# Patient Record
Sex: Male | Born: 1945 | Race: White | Hispanic: No | Marital: Married | Smoking: Never smoker
Health system: Southern US, Community
[De-identification: ages and names within clinical notes are randomized; demographics above are authoritative.]

## PROBLEM LIST (undated history)

## (undated) DIAGNOSIS — M1990 Unspecified osteoarthritis, unspecified site: Secondary | ICD-10-CM

## (undated) DIAGNOSIS — Z86718 Personal history of other venous thrombosis and embolism: Secondary | ICD-10-CM

## (undated) DIAGNOSIS — I499 Cardiac arrhythmia, unspecified: Secondary | ICD-10-CM

## (undated) DIAGNOSIS — E785 Hyperlipidemia, unspecified: Secondary | ICD-10-CM

## (undated) DIAGNOSIS — R42 Dizziness and giddiness: Secondary | ICD-10-CM

## (undated) DIAGNOSIS — I10 Essential (primary) hypertension: Secondary | ICD-10-CM

## (undated) DIAGNOSIS — I493 Ventricular premature depolarization: Secondary | ICD-10-CM

## (undated) DIAGNOSIS — K219 Gastro-esophageal reflux disease without esophagitis: Secondary | ICD-10-CM

## (undated) DIAGNOSIS — R9431 Abnormal electrocardiogram [ECG] [EKG]: Secondary | ICD-10-CM

## (undated) DIAGNOSIS — R001 Bradycardia, unspecified: Secondary | ICD-10-CM

## (undated) HISTORY — DX: Hyperlipidemia, unspecified: E78.5

## (undated) HISTORY — DX: Essential (primary) hypertension: I10

---
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
## 1999-02-11 HISTORY — PX: FRACTURE SURGERY: SHX138

## 2011-03-25 DIAGNOSIS — M546 Pain in thoracic spine: Secondary | ICD-10-CM | POA: Diagnosis not present

## 2011-05-02 DIAGNOSIS — D485 Neoplasm of uncertain behavior of skin: Secondary | ICD-10-CM | POA: Diagnosis not present

## 2011-05-02 DIAGNOSIS — L570 Actinic keratosis: Secondary | ICD-10-CM | POA: Diagnosis not present

## 2011-05-02 DIAGNOSIS — L821 Other seborrheic keratosis: Secondary | ICD-10-CM | POA: Diagnosis not present

## 2011-10-08 DIAGNOSIS — Z23 Encounter for immunization: Secondary | ICD-10-CM | POA: Diagnosis not present

## 2011-12-04 ENCOUNTER — Ambulatory Visit (INDEPENDENT_AMBULATORY_CARE_PROVIDER_SITE_OTHER): Payer: Medicare Other | Admitting: Family Medicine

## 2011-12-04 VITALS — BP 108/70 | HR 66 | Temp 97.9°F | Resp 16 | Ht 74.0 in | Wt 214.0 lb

## 2011-12-04 DIAGNOSIS — J029 Acute pharyngitis, unspecified: Secondary | ICD-10-CM | POA: Diagnosis not present

## 2011-12-04 DIAGNOSIS — R197 Diarrhea, unspecified: Secondary | ICD-10-CM

## 2011-12-04 DIAGNOSIS — R1110 Vomiting, unspecified: Secondary | ICD-10-CM | POA: Diagnosis not present

## 2011-12-04 LAB — POCT CBC
Granulocyte percent: 55.9 %G (ref 37–80)
HCT, POC: 42.8 % — AB (ref 43.5–53.7)
Hemoglobin: 13.3 g/dL — AB (ref 14.1–18.1)
Lymph, poc: 1.8 (ref 0.6–3.4)
MCH, POC: 29.7 pg (ref 27–31.2)
MCHC: 31.1 g/dL — AB (ref 31.8–35.4)
MCV: 95.5 fL (ref 80–97)
MID (cbc): 0.4 (ref 0–0.9)
MPV: 7.5 fL (ref 0–99.8)
POC Granulocyte: 2.9 (ref 2–6.9)
POC LYMPH PERCENT: 35.9 % (ref 10–50)
POC MID %: 8.2 %M (ref 0–12)
Platelet Count, POC: 237 10*3/uL (ref 142–424)
RBC: 4.48 M/uL — AB (ref 4.69–6.13)
RDW, POC: 13.6 %
WBC: 5.1 10*3/uL (ref 4.6–10.2)

## 2011-12-04 LAB — COMPREHENSIVE METABOLIC PANEL
ALT: 39 U/L (ref 0–53)
AST: 27 U/L (ref 0–37)
CO2: 27 mEq/L (ref 19–32)
Calcium: 9.7 mg/dL (ref 8.4–10.5)
Chloride: 108 mEq/L (ref 96–112)
Creat: 0.95 mg/dL (ref 0.50–1.35)
Potassium: 4.8 mEq/L (ref 3.5–5.3)
Sodium: 142 mEq/L (ref 135–145)
Total Protein: 7 g/dL (ref 6.0–8.3)

## 2011-12-04 LAB — POCT RAPID STREP A (OFFICE): Rapid Strep A Screen: NEGATIVE

## 2011-12-04 LAB — COMPREHENSIVE METABOLIC PANEL WITH GFR
Albumin: 4.3 g/dL (ref 3.5–5.2)
Alkaline Phosphatase: 34 U/L — ABNORMAL LOW (ref 39–117)
BUN: 14 mg/dL (ref 6–23)
Glucose, Bld: 98 mg/dL (ref 70–99)
Total Bilirubin: 0.5 mg/dL (ref 0.3–1.2)

## 2011-12-04 NOTE — Progress Notes (Signed)
 [facility]:  Office Visit  Chief Complaint:  Chief Complaint  Patient presents with  . Sore Throat    exposed to strep  . Diarrhea    had stomach sx's over the weekend- doing better now  . Emesis    HPI: Stephen Roy is a 66 y.o. male who complains of  Sore throat, msk aches and pains after vomiting and diarrhea this pass weekend. He had nonbloody diarrhea and vomiting. He was at a [place] and ate boxed lunches which is the only thing that is new, his sxs are improved.  Denies new meds or sick GI contacts. No one else at home has similar sxs. Was in golf tournament but no one else has it.  Last episode of diarrhea was 5 days ago. Now has loose stools. Has decrease PO due to loss of appetitite. MInimal abd cramps and acid indigestion. He is here with wife who has sinusitis sxs and he too has been exposed to strep throat via grandchildren.  Past Medical History  Diagnosis Date  . Hypertension   . Hyperlipidemia    Past Surgical History  Procedure Date  . Fracture surgery    History   Social History  . Marital Status: Married    Spouse Name: N/A    Number of Children: N/A  . Years of Education: N/A   Social History Main Topics  . Smoking status: Never Smoker   . Smokeless tobacco: None  . Alcohol Use: 2.5 oz/week    5 drink(s) per week  . Drug Use: No  . Sexually Active: None   Other Topics Concern  . None   Social History Narrative  . None   Family History  Problem Relation Age of Onset  . Heart disease Father    No Known Allergies Prior to Admission medications   Medication Sig Start Date End Date Taking? Authorizing Provider  amLODipine (NORVASC) 5 MG tablet Take 5 mg by mouth daily.   Yes Historical Provider, MD  atorvastatin (LIPITOR) 10 MG tablet Take 10 mg by mouth daily.   Yes Historical Provider, MD  irbesartan (AVAPRO) 150 MG tablet Take 150 mg by mouth at bedtime.   Yes Historical Provider, MD     ROS: The patient denies  fevers, chills, night sweats, unintentional weight loss, chest pain, palpitations, wheezing, dyspnea on exertion,dysuria, hematuria, melena, numbness, weakness, or tingling.  All other systems have been reviewed and were otherwise negative with the exception of those mentioned in the HPI and as above.    PHYSICAL EXAM: Filed Vitals:   12/04/11 1121  BP: 108/70  Pulse: 66  Temp: 97.9 F (36.6 C)  Resp: 16   Filed Vitals:   12/04/11 1121  Height: 6\' 2"  (1.88 m)  Weight: 214 lb (97.07 kg)   Body mass index is 27.48 kg/(m^2).  General: Alert, no acute distress HEENT:  Normocephalic, atraumatic, oropharynx patent. No exudates, Tm nl. No sinus tenderness Cardiovascular:  Regular rate and rhythm, no rubs murmurs or gallops.  No Carotid bruits, radial pulse intact. No pedal edema.  Respiratory: Clear to auscultation bilaterally.  No wheezes, rales, or rhonchi.  No cyanosis, no use of accessory musculature GI: No organomegaly, abdomen is soft and non-tender, positive bowel sounds.  No masses. Skin: No rashes. Neurologic: Facial musculature symmetric. Psychiatric: Patient is appropriate throughout our interaction. Lymphatic: No cervical lymphadenopathy Musculoskeletal: Gait intact.   LABS: Results for orders placed in visit on 12/04/11  POCT CBC  Component Value Range   WBC 5.1  4.6 - 10.2 K/uL   Lymph, poc 1.8  0.6 - 3.4   POC LYMPH PERCENT 35.9  10 - 50 %L   MID (cbc) 0.4  0 - 0.9   POC MID % 8.2  0 - 12 %M   POC Granulocyte 2.9  2 - 6.9   Granulocyte percent 55.9  37 - 80 %G   RBC 4.48 (*) 4.69 - 6.13 M/uL   Hemoglobin 13.3 (*) 14.1 - 18.1 g/dL   HCT, POC 95.2 (*) 84.1 - 53.7 %   MCV 95.5  80 - 97 fL   MCH, POC 29.7  27 - 31.2 pg   MCHC 31.1 (*) 31.8 - 35.4 g/dL   RDW, POC 32.4     Platelet Count, POC 237  142 - 424 K/uL   MPV 7.5  0 - 99.8 fL  POCT RAPID STREP A (OFFICE)      Component Value Range   Rapid Strep A Screen Negative  Negative     EKG/XRAY:     Primary read interpreted by Dr. Conley Rolls at [facility].   ASSESSMENT/PLAN: Encounter Diagnoses  Name Primary?  . Diarrhea Yes  . Vomiting   . Pharyngitis     Rx for Amox 875 mg BID #20 , no RF-- given for pahryngitis and strep exposure Recommend no to take it unless sxs worsen, his GI sxs which are most lilkely viral gastroenteritis BRAT and push fluids   ,  PHUONG, DO 12/04/2011 12:53 PM

## 2011-12-21 ENCOUNTER — Encounter: Payer: Self-pay | Admitting: Family Medicine

## 2012-02-16 DIAGNOSIS — E7800 Pure hypercholesterolemia, unspecified: Secondary | ICD-10-CM | POA: Diagnosis not present

## 2012-02-16 DIAGNOSIS — Z0000 Encounter for general adult medical examination without abnormal findings: Secondary | ICD-10-CM | POA: Diagnosis not present

## 2012-02-16 DIAGNOSIS — Z8042 Family history of malignant neoplasm of prostate: Secondary | ICD-10-CM | POA: Diagnosis not present

## 2012-02-16 DIAGNOSIS — E782 Mixed hyperlipidemia: Secondary | ICD-10-CM | POA: Diagnosis not present

## 2012-02-16 DIAGNOSIS — I10 Essential (primary) hypertension: Secondary | ICD-10-CM | POA: Diagnosis not present

## 2012-02-16 DIAGNOSIS — M2550 Pain in unspecified joint: Secondary | ICD-10-CM | POA: Diagnosis not present

## 2012-03-18 DIAGNOSIS — M25559 Pain in unspecified hip: Secondary | ICD-10-CM | POA: Diagnosis not present

## 2012-03-18 DIAGNOSIS — M76899 Other specified enthesopathies of unspecified lower limb, excluding foot: Secondary | ICD-10-CM | POA: Diagnosis not present

## 2012-05-06 DIAGNOSIS — D235 Other benign neoplasm of skin of trunk: Secondary | ICD-10-CM | POA: Diagnosis not present

## 2012-05-06 DIAGNOSIS — L570 Actinic keratosis: Secondary | ICD-10-CM | POA: Diagnosis not present

## 2012-11-30 DIAGNOSIS — Z23 Encounter for immunization: Secondary | ICD-10-CM | POA: Diagnosis not present

## 2013-01-02 ENCOUNTER — Ambulatory Visit (INDEPENDENT_AMBULATORY_CARE_PROVIDER_SITE_OTHER): Payer: Medicare Other | Admitting: Family Medicine

## 2013-01-02 VITALS — BP 136/72 | HR 78 | Temp 99.6°F | Resp 18 | Ht 74.0 in | Wt 218.6 lb

## 2013-01-02 DIAGNOSIS — R059 Cough, unspecified: Secondary | ICD-10-CM | POA: Diagnosis not present

## 2013-01-02 DIAGNOSIS — J209 Acute bronchitis, unspecified: Secondary | ICD-10-CM | POA: Diagnosis not present

## 2013-01-02 DIAGNOSIS — J019 Acute sinusitis, unspecified: Secondary | ICD-10-CM

## 2013-01-02 DIAGNOSIS — R05 Cough: Secondary | ICD-10-CM | POA: Diagnosis not present

## 2013-01-02 MED ORDER — FLUTICASONE PROPIONATE 50 MCG/ACT NA SUSP: 2.0000 | Freq: Every day | NASAL | Status: DC

## 2013-01-02 MED ORDER — AZITHROMYCIN 250 MG PO TABS: ORAL_TABLET | ORAL | Status: DC

## 2013-01-02 NOTE — Progress Notes (Signed)
[facility]:  Office Visit  Chief Complaint:  Chief Complaint  Patient presents with  . Cough    started 1 week ago, no improvements, has had flu vaccine  . Nasal Congestion  . Generalized Body Aches    HPI: Stephen Roy is a 67 y.o. male who is here for runny nose, stuffy, productive cough at times with yellowish-green-brown sputum, chest tightness with deep inspiration, body aches in muscles and joints.  Pt states these symptoms started 7 days ago, that included sore throat. Taking Claridin D in am and Benedryl over the counter in pm since last week and sore throat is now itchy and dry. Flu vaccine UTD. HE is planning to go to [place] to go skiing for about 2 weeks soon and wants to start endurance training for it. Deneis SOB, wheezes. Has tried otc meds with some relief.  Denies asthma/allergies Pt in car with 67 year old and 7 year old grandchildren for 12 hours during road trip Friday (12/24/2012) to [facility].  67yr old had runny nose.   Past Medical History  Diagnosis Date  . Hypertension   . Hyperlipidemia    Past Surgical History  Procedure Laterality Date  . Fracture surgery    . Tonsillectomy     History   Social History  . Marital Status: Married    Spouse Name: N/A    Number of Children: N/A  . Years of Education: N/A   Social History Main Topics  . Smoking status: Never Smoker   . Smokeless tobacco: None  . Alcohol Use: 2.5 oz/week    5 drink(s) per week  . Drug Use: No  . Sexual Activity: None   Other Topics Concern  . None   Social History Narrative  . None   Family History  Problem Relation Age of Onset  . Heart disease Father    No Known Allergies Prior to Admission medications   Medication Sig Start Date End Date Taking? Authorizing Provider  amLODipine (NORVASC) 5 MG tablet Take 5 mg by mouth daily.   Yes Historical Provider, MD  atorvastatin (LIPITOR) 10 MG tablet Take 10 mg by mouth daily.   Yes Historical Provider,  MD  irbesartan (AVAPRO) 150 MG tablet Take 150 mg by mouth at bedtime.   Yes Historical Provider, MD     ROS: The patient denies fevers, chills, night sweats, unintentional weight loss,  palpitations, wheezing, dyspnea on exertion, nausea, vomiting, abdominal pain, dysuria, hematuria, melena, numbness, weakness, or tingling.   All other systems have been reviewed and were otherwise negative with the exception of those mentioned in the HPI and as above.    PHYSICAL EXAM: Filed Vitals:   01/02/13 1150  BP: 136/72  Pulse: 78  Temp: 99.6 F (37.6 C)  Resp: 18  Spo2 98% Filed Vitals:   01/02/13 1150  Height: 6\' 2"  (1.88 m)  Weight: 218 lb 9.6 oz (99.156 kg)   Body mass index is 28.05 kg/(m^2).  General: Alert, no acute distress HEENT:  Normocephalic, atraumatic, oropharynx patent. EOMI, PERRLA, + sinus tenderness, Tm nl, no exudates Cardiovascular:  Regular rate and rhythm, no rubs murmurs or gallops.  No Carotid bruits, radial pulse intact. No pedal edema.  Respiratory: Clear to auscultation bilaterally.  No wheezes, rales, or rhonchi.  No cyanosis, no use of accessory musculature GI: No organomegaly, abdomen is soft and non-tender, positive bowel sounds.  No masses. Skin: No rashes. Neurologic: Facial musculature symmetric. Psychiatric: Patient is appropriate  throughout our interaction. Lymphatic: No cervical lymphadenopathy Musculoskeletal: Gait intact.   LABS: Results for orders placed in visit on 12/04/11  COMPREHENSIVE METABOLIC PANEL      Result Value Range   Sodium 142  135 - 145 mEq/L   Potassium 4.8  3.5 - 5.3 mEq/L   Chloride 108  96 - 112 mEq/L   CO2 27  19 - 32 mEq/L   Glucose, Bld 98  70 - 99 mg/dL   BUN 14  6 - 23 mg/dL   Creat 1.47  8.29 - 5.62 mg/dL   Total Bilirubin 0.5  0.3 - 1.2 mg/dL   Alkaline Phosphatase 34 (*) 39 - 117 U/L   AST 27  0 - 37 U/L   ALT 39  0 - 53 U/L   Total Protein 7.0  6.0 - 8.3 g/dL   Albumin 4.3  3.5 - 5.2 g/dL   Calcium 9.7   8.4 - 13.0 mg/dL  POCT CBC      Result Value Range   WBC 5.1  4.6 - 10.2 K/uL   Lymph, poc 1.8  0.6 - 3.4   POC LYMPH PERCENT 35.9  10 - 50 %L   MID (cbc) 0.4  0 - 0.9   POC MID % 8.2  0 - 12 %M   POC Granulocyte 2.9  2 - 6.9   Granulocyte percent 55.9  37 - 80 %G   RBC 4.48 (*) 4.69 - 6.13 M/uL   Hemoglobin 13.3 (*) 14.1 - 18.1 g/dL   HCT, POC 86.5 (*) 78.4 - 53.7 %   MCV 95.5  80 - 97 fL   MCH, POC 29.7  27 - 31.2 pg   MCHC 31.1 (*) 31.8 - 35.4 g/dL   RDW, POC 69.6     Platelet Count, POC 237  142 - 424 K/uL   MPV 7.5  0 - 99.8 fL  POCT RAPID STREP A (OFFICE)      Result Value Range   Rapid Strep A Screen Negative  Negative     EKG/XRAY:   Primary read interpreted by Dr. Conley Rolls at [facility].   ASSESSMENT/PLAN: Encounter Diagnoses  Name Primary?  . Sinusitis, acute Yes  . Acute bronchitis   . Cough    Rx Zpack, flonase otc cough medicine Sx treatment first , if no improvement then try zpack F/u prn  Gross sideeffects, risk and benefits, and alternatives of medications d/w patient. Patient is aware that all medications have potential sideeffects and we are unable to predict every sideeffect or drug-drug interaction that may occur.  LE, THAO PHUONG, DO 01/09/2013 1:58 PM

## 2013-01-02 NOTE — Patient Instructions (Signed)
Acute Bronchitis  Bronchitis is when the airways that extend from the windpipe into the lungs get red, puffy, and painful (inflamed). Bronchitis often causes thick spit (mucus) to develop. This leads to a cough. A cough is the most common symptom of bronchitis.  In acute bronchitis, the condition usually begins suddenly and goes away over time (usually in 2 weeks). Smoking, allergies, and asthma can make bronchitis worse. Repeated episodes of bronchitis may cause more lung problems.  HOME CARE  · Rest.  · Drink enough fluids to keep your pee (urine) clear or pale yellow (unless you need to limit fluids as told by your doctor).  · Only take over-the-counter or prescription medicines as told by your doctor.  · Avoid smoking and secondhand smoke. These can make bronchitis worse. If you are a smoker, think about using nicotine gum or skin patches. Quitting smoking will help your lungs heal faster.  · Reduce the chance of getting bronchitis again by:  · Washing your hands often.  · Avoiding people with cold symptoms.  · Trying not to touch your hands to your mouth, nose, or eyes.  · Follow up with your doctor as told.  GET HELP IF:  Your symptoms do not improve after 1 week of treatment. Symptoms include:  · Cough.  · Fever.  · Coughing up thick spit.  · Body aches.  · Chest congestion.  · Chills.  · Shortness of breath.  · Sore throat.  GET HELP RIGHT AWAY IF:   · You have an increased fever.  · You have chills.  · You have severe shortness of breath.  · You have bloody thick spit (sputum).  · You throw up (vomit) often.  · You lose too much body fluid (dehydration).  · You have a severe headache.  · You faint.  MAKE SURE YOU:   · Understand these instructions.  · Will watch your condition.  · Will get help right away if you are not doing well or get worse.  Document Released: 07/16/2007 Document Revised: 09/29/2012 Document Reviewed: 07/20/2012  Exit[employer]® Patient Information ©2014 [employer].

## 2013-06-06 ENCOUNTER — Encounter: Payer: Self-pay | Admitting: Gastroenterology

## 2013-06-06 DIAGNOSIS — E785 Hyperlipidemia, unspecified: Secondary | ICD-10-CM | POA: Diagnosis not present

## 2013-06-06 DIAGNOSIS — I10 Essential (primary) hypertension: Secondary | ICD-10-CM | POA: Diagnosis not present

## 2013-06-06 DIAGNOSIS — Z6829 Body mass index (BMI) 29.0-29.9, adult: Secondary | ICD-10-CM | POA: Diagnosis not present

## 2013-06-06 DIAGNOSIS — Z1331 Encounter for screening for depression: Secondary | ICD-10-CM | POA: Diagnosis not present

## 2013-07-08 ENCOUNTER — Telehealth: Payer: Self-pay | Admitting: Gastroenterology

## 2013-07-08 NOTE — Telephone Encounter (Signed)
Left message on machine to call back previsit on colon cx recall in EPIC

## 2013-07-08 NOTE — Telephone Encounter (Signed)
Outside records  Colonoscopy 11/2006 Dr. Loreli Dollar Stephen Roy's [facility]; done for "had colonoscopy several years ago with removal of some polyps." Findings: a "minute polyp", sigmoid diverticulosis. Polyp was a tubular adenoma on pathology. He was recommended to have repeat colonoscopy in 3 years. Colonoscopy 10/2009 Dr. Margart Sickles, same location; done for "patient had an adenoma removed 3 years ago and now presents for follow up."  Findings; diverticulosis.  Recommended "colonoscopy in 4-5 years would be indicated"   I see he is scheduled for pre-visit appointment next week. Can you call him.  I reviewed his records and he does not need surveillance at least until 10/2014.  Please cancel his upcoming previsit and colonoscopy.  Offer him [facility] office appt if he wants to discuss this in the office.  Recall colonoscopy 10/2014.

## 2013-07-11 DIAGNOSIS — E785 Hyperlipidemia, unspecified: Secondary | ICD-10-CM | POA: Diagnosis not present

## 2013-07-11 DIAGNOSIS — I10 Essential (primary) hypertension: Secondary | ICD-10-CM | POA: Diagnosis not present

## 2013-07-11 DIAGNOSIS — Z125 Encounter for screening for malignant neoplasm of prostate: Secondary | ICD-10-CM | POA: Diagnosis not present

## 2013-07-11 NOTE — Telephone Encounter (Signed)
I have spoken to patient and have given him Dr Ardis Hughs recommendations as below. He verbalizes understanding. Patient does not wish for office visit to discuss at this time, but if he changes his mind, he will call back.

## 2013-07-18 DIAGNOSIS — Z6829 Body mass index (BMI) 29.0-29.9, adult: Secondary | ICD-10-CM | POA: Diagnosis not present

## 2013-07-18 DIAGNOSIS — Z125 Encounter for screening for malignant neoplasm of prostate: Secondary | ICD-10-CM | POA: Diagnosis not present

## 2013-07-18 DIAGNOSIS — E785 Hyperlipidemia, unspecified: Secondary | ICD-10-CM | POA: Diagnosis not present

## 2013-07-18 DIAGNOSIS — I10 Essential (primary) hypertension: Secondary | ICD-10-CM | POA: Diagnosis not present

## 2013-07-18 DIAGNOSIS — Z0000 Encounter for general adult medical examination without abnormal findings: Secondary | ICD-10-CM | POA: Diagnosis not present

## 2013-07-18 DIAGNOSIS — Z23 Encounter for immunization: Secondary | ICD-10-CM | POA: Diagnosis not present

## 2013-07-19 DIAGNOSIS — Z1212 Encounter for screening for malignant neoplasm of rectum: Secondary | ICD-10-CM | POA: Diagnosis not present

## 2013-07-25 ENCOUNTER — Encounter: Payer: No Typology Code available for payment source | Admitting: Gastroenterology

## 2013-08-01 ENCOUNTER — Encounter: Payer: No Typology Code available for payment source | Admitting: Gastroenterology

## 2013-09-26 DIAGNOSIS — H02839 Dermatochalasis of unspecified eye, unspecified eyelid: Secondary | ICD-10-CM | POA: Diagnosis not present

## 2013-09-26 DIAGNOSIS — H01009 Unspecified blepharitis unspecified eye, unspecified eyelid: Secondary | ICD-10-CM | POA: Diagnosis not present

## 2013-09-26 DIAGNOSIS — H2510 Age-related nuclear cataract, unspecified eye: Secondary | ICD-10-CM | POA: Diagnosis not present

## 2014-02-14 DIAGNOSIS — Z23 Encounter for immunization: Secondary | ICD-10-CM | POA: Diagnosis not present

## 2014-06-02 DIAGNOSIS — D2372 Other benign neoplasm of skin of left lower limb, including hip: Secondary | ICD-10-CM | POA: Diagnosis not present

## 2014-06-02 DIAGNOSIS — L821 Other seborrheic keratosis: Secondary | ICD-10-CM | POA: Diagnosis not present

## 2014-06-02 DIAGNOSIS — L820 Inflamed seborrheic keratosis: Secondary | ICD-10-CM | POA: Diagnosis not present

## 2014-06-02 DIAGNOSIS — L738 Other specified follicular disorders: Secondary | ICD-10-CM | POA: Diagnosis not present

## 2014-06-02 DIAGNOSIS — D485 Neoplasm of uncertain behavior of skin: Secondary | ICD-10-CM | POA: Diagnosis not present

## 2014-06-02 DIAGNOSIS — L570 Actinic keratosis: Secondary | ICD-10-CM | POA: Diagnosis not present

## 2014-08-04 DIAGNOSIS — E785 Hyperlipidemia, unspecified: Secondary | ICD-10-CM | POA: Diagnosis not present

## 2014-08-04 DIAGNOSIS — I10 Essential (primary) hypertension: Secondary | ICD-10-CM | POA: Diagnosis not present

## 2014-08-04 DIAGNOSIS — Z125 Encounter for screening for malignant neoplasm of prostate: Secondary | ICD-10-CM | POA: Diagnosis not present

## 2014-08-10 DIAGNOSIS — Z23 Encounter for immunization: Secondary | ICD-10-CM | POA: Diagnosis not present

## 2014-08-10 DIAGNOSIS — Z6828 Body mass index (BMI) 28.0-28.9, adult: Secondary | ICD-10-CM | POA: Diagnosis not present

## 2014-08-10 DIAGNOSIS — E785 Hyperlipidemia, unspecified: Secondary | ICD-10-CM | POA: Diagnosis not present

## 2014-08-10 DIAGNOSIS — Z1389 Encounter for screening for other disorder: Secondary | ICD-10-CM | POA: Diagnosis not present

## 2014-08-10 DIAGNOSIS — I10 Essential (primary) hypertension: Secondary | ICD-10-CM | POA: Diagnosis not present

## 2014-08-10 DIAGNOSIS — Z0000 Encounter for general adult medical examination without abnormal findings: Secondary | ICD-10-CM | POA: Diagnosis not present

## 2014-08-16 DIAGNOSIS — Z1212 Encounter for screening for malignant neoplasm of rectum: Secondary | ICD-10-CM | POA: Diagnosis not present

## 2014-09-13 ENCOUNTER — Ambulatory Visit (INDEPENDENT_AMBULATORY_CARE_PROVIDER_SITE_OTHER): Payer: Medicare Other | Admitting: Emergency Medicine

## 2014-09-13 VITALS — BP 128/70 | HR 53 | Temp 98.5°F | Resp 16 | Ht 74.0 in | Wt 211.4 lb

## 2014-09-13 DIAGNOSIS — S91001A Unspecified open wound, right ankle, initial encounter: Secondary | ICD-10-CM | POA: Diagnosis not present

## 2014-09-13 MED ORDER — SILVER SULFADIAZINE 1 % EX CREA: 1.0000 "application " | TOPICAL_CREAM | Freq: Every day | CUTANEOUS | Status: DC

## 2014-09-13 NOTE — Progress Notes (Addendum)
Patient ID: Stephen Roy, male   DOB: 14-Oct-1945, 69 y.o.   MRN: [id number]    This chart was scribed for Nena Jordan, MD by [facility], medical scribe at [facility].The patient was seen in exam room 03 and the patient's care was started at 3:05 PM.  Chief Complaint:  Chief Complaint  Patient presents with  . Burn    rt. ankle, x 1 day    HPI: Stephen Roy is a 69 y.o. male who reports to [facility] today complaining of a chemical burn to the right ankle. He was playing golf yesterday and suddenly felt a sting, burning sensation. He noticed and noticed a soapy substance on his right ankle. Pt is unsure what the source is. Sprayed hydrogen peroxide last night and used a bactroban ointment. No pain with movement of the ankle, but sore to the touch.  Past Medical History  Diagnosis Date  . Hypertension   . Hyperlipidemia    Past Surgical History  Procedure Laterality Date  . Fracture surgery    . Tonsillectomy     History   Social History  . Marital Status: Married    Spouse Name: N/A  . Number of Children: N/A  . Years of Education: N/A   Social History Main Topics  . Smoking status: Never Smoker   . Smokeless tobacco: Never Used  . Alcohol Use: 2.5 oz/week    5 Standard drinks or equivalent per week  . Drug Use: No  . Sexual Activity: Not on file   Other Topics Concern  . None   Social History Narrative   Family History  Problem Relation Age of Onset  . Heart disease Father    No Known Allergies Prior to Admission medications   Medication Sig Start Date End Date Taking? Authorizing Provider  amLODipine (NORVASC) 5 MG tablet Take 5 mg by mouth daily.   Yes Historical Provider, MD  atorvastatin (LIPITOR) 10 MG tablet Take 10 mg by mouth daily.   Yes Historical Provider, MD  irbesartan (AVAPRO) 150 MG tablet Take 150 mg by mouth at bedtime.   Yes Historical Provider, MD  azithromycin (ZITHROMAX) 250 MG tablet Take 2 tabs po now, then 1 tab po  daily. Patient not taking: Reported on 09/13/2014 01/02/13   Thao P Le, DO  fluticasone (FLONASE) 50 MCG/ACT nasal spray Place 2 sprays into both nostrils daily. Patient not taking: Reported on 09/13/2014 01/02/13   Thao P Le, DO   ROS: The patient denies fevers, chills, night sweats, unintentional weight loss, chest pain, palpitations, wheezing, dyspnea on exertion, nausea, vomiting, abdominal pain, dysuria, hematuria, melena, numbness, weakness, or tingling.   All other systems have been reviewed and were otherwise negative with the exception of those mentioned in the HPI and as above.    PHYSICAL EXAM: Filed Vitals:   09/13/14 1414  BP: 128/70  Pulse: 53  Temp: 98.5 F (36.9 C)  Resp: 16   Body mass index is 27.13 kg/(m^2).   General: Alert, no acute distress HEENT:  Normocephalic, atraumatic, oropharynx patent. Eye: Juliette Mangle [facility] Cardiovascular:  Regular rate and rhythm, no rubs murmurs or gallops.  No Carotid bruits, radial pulse intact. No pedal edema.  Respiratory: Clear to auscultation bilaterally.  No wheezes, rales, or rhonchi.  No cyanosis, no use of accessory musculature Abdominal: No organomegaly, abdomen is soft and non-tender, positive bowel sounds.  No masses. Musculoskeletal: Gait intact. No edema, tenderness Skin: There is a 5 x 6  cm area of skin loss on the right lateral malleolus. No surrounding erythema or evidence of skin loss. Neurologic: Facial musculature symmetric. Psychiatric: Patient acts appropriately throughout our interaction. Lymphatic: No cervical or submandibular lymphadenopathy Genitourinary/Anorectal: No acute findings  LABS: Results for orders placed or performed in visit on 12/04/11  Comprehensive metabolic panel  Result Value Ref Range   Sodium 142 135 - 145 mEq/L   Potassium 4.8 3.5 - 5.3 mEq/L   Chloride 108 96 - 112 mEq/L   CO2 27 19 - 32 mEq/L   Glucose, Bld 98 70 - 99 mg/dL   BUN 14 6 - 23 mg/dL   Creat 0.95 0.50 - 1.35 mg/dL   Total  Bilirubin 0.5 0.3 - 1.2 mg/dL   Alkaline Phosphatase 34 (L) 39 - 117 U/L   AST 27 0 - 37 U/L   ALT 39 0 - 53 U/L   Total Protein 7.0 6.0 - 8.3 g/dL   Albumin 4.3 3.5 - 5.2 g/dL   Calcium 9.7 8.4 - 10.5 mg/dL  POCT CBC  Result Value Ref Range   WBC 5.1 4.6 - 10.2 K/uL   Lymph, poc 1.8 0.6 - 3.4   POC LYMPH PERCENT 35.9 10 - 50 %L   MID (cbc) 0.4 0 - 0.9   POC MID % 8.2 0 - 12 %M   POC Granulocyte 2.9 2 - 6.9   Granulocyte percent 55.9 37 - 80 %G   RBC 4.48 (A) 4.69 - 6.13 M/uL   Hemoglobin 13.3 (A) 14.1 - 18.1 g/dL   HCT, POC 42.8 (A) 43.5 - 53.7 %   MCV 95.5 80 - 97 fL   MCH, POC 29.7 27 - 31.2 pg   MCHC 31.1 (A) 31.8 - 35.4 g/dL   RDW, POC 13.6 %   Platelet Count, POC 237 142 - 424 K/uL   MPV 7.5 0 - 99.8 fL  POCT rapid strep A  Result Value Ref Range   Rapid Strep A Screen Negative Negative   EKG/XRAY:   Primary read interpreted by Dr. Everlene Farrier at [facility].  ASSESSMENT/PLAN: Patient has what looks like chemical burn on his ankle. He will treat this with Silvadene. Recheck if worsening or redness around the burn site. His tetanus is up-to-date given in 2015.  Gross sideeffects, risk and benefits, and alternatives of medications d/w patient. Patient is aware that all medications have potential sideeffects and we are unable to predict every sideeffect or drug-drug interaction that may occur.    Arlyss Queen MD 09/13/2014 3:05 PM

## 2014-09-13 NOTE — Patient Instructions (Signed)
Apply cream twice a day after soap and water cleaning. Return to clinic if you develop any redness around the burn site. Recheck in one week if not having significant healing.

## 2014-09-25 DIAGNOSIS — H2513 Age-related nuclear cataract, bilateral: Secondary | ICD-10-CM | POA: Diagnosis not present

## 2014-11-27 ENCOUNTER — Encounter: Payer: Self-pay | Admitting: Gastroenterology

## 2015-01-26 DIAGNOSIS — L570 Actinic keratosis: Secondary | ICD-10-CM | POA: Diagnosis not present

## 2015-01-26 DIAGNOSIS — D2372 Other benign neoplasm of skin of left lower limb, including hip: Secondary | ICD-10-CM | POA: Diagnosis not present

## 2015-01-26 DIAGNOSIS — L821 Other seborrheic keratosis: Secondary | ICD-10-CM | POA: Diagnosis not present

## 2015-01-26 DIAGNOSIS — Z23 Encounter for immunization: Secondary | ICD-10-CM | POA: Diagnosis not present

## 2015-01-26 DIAGNOSIS — D485 Neoplasm of uncertain behavior of skin: Secondary | ICD-10-CM | POA: Diagnosis not present

## 2015-01-26 DIAGNOSIS — L578 Other skin changes due to chronic exposure to nonionizing radiation: Secondary | ICD-10-CM | POA: Diagnosis not present

## 2015-01-26 DIAGNOSIS — D1801 Hemangioma of skin and subcutaneous tissue: Secondary | ICD-10-CM | POA: Diagnosis not present

## 2015-04-20 DIAGNOSIS — R238 Other skin changes: Secondary | ICD-10-CM | POA: Diagnosis not present

## 2015-04-20 DIAGNOSIS — L538 Other specified erythematous conditions: Secondary | ICD-10-CM | POA: Diagnosis not present

## 2015-04-20 DIAGNOSIS — L578 Other skin changes due to chronic exposure to nonionizing radiation: Secondary | ICD-10-CM | POA: Diagnosis not present

## 2015-04-20 DIAGNOSIS — L820 Inflamed seborrheic keratosis: Secondary | ICD-10-CM | POA: Diagnosis not present

## 2015-04-20 DIAGNOSIS — L309 Dermatitis, unspecified: Secondary | ICD-10-CM | POA: Diagnosis not present

## 2015-04-20 DIAGNOSIS — L84 Corns and callosities: Secondary | ICD-10-CM | POA: Diagnosis not present

## 2015-08-10 DIAGNOSIS — I10 Essential (primary) hypertension: Secondary | ICD-10-CM | POA: Diagnosis not present

## 2015-08-10 DIAGNOSIS — E784 Other hyperlipidemia: Secondary | ICD-10-CM | POA: Diagnosis not present

## 2015-08-10 DIAGNOSIS — Z125 Encounter for screening for malignant neoplasm of prostate: Secondary | ICD-10-CM | POA: Diagnosis not present

## 2015-08-20 DIAGNOSIS — I10 Essential (primary) hypertension: Secondary | ICD-10-CM | POA: Diagnosis not present

## 2015-08-20 DIAGNOSIS — Z6829 Body mass index (BMI) 29.0-29.9, adult: Secondary | ICD-10-CM | POA: Diagnosis not present

## 2015-08-20 DIAGNOSIS — Z23 Encounter for immunization: Secondary | ICD-10-CM | POA: Diagnosis not present

## 2015-08-20 DIAGNOSIS — Z1389 Encounter for screening for other disorder: Secondary | ICD-10-CM | POA: Diagnosis not present

## 2015-08-20 DIAGNOSIS — Z125 Encounter for screening for malignant neoplasm of prostate: Secondary | ICD-10-CM | POA: Diagnosis not present

## 2015-08-20 DIAGNOSIS — Z0000 Encounter for general adult medical examination without abnormal findings: Secondary | ICD-10-CM | POA: Diagnosis not present

## 2015-08-20 DIAGNOSIS — E784 Other hyperlipidemia: Secondary | ICD-10-CM | POA: Diagnosis not present

## 2015-08-22 ENCOUNTER — Encounter: Payer: Self-pay | Admitting: Gastroenterology

## 2015-08-27 DIAGNOSIS — Z1212 Encounter for screening for malignant neoplasm of rectum: Secondary | ICD-10-CM | POA: Diagnosis not present

## 2015-10-01 DIAGNOSIS — H2513 Age-related nuclear cataract, bilateral: Secondary | ICD-10-CM | POA: Diagnosis not present

## 2015-10-01 DIAGNOSIS — Z0100 Encounter for examination of eyes and vision without abnormal findings: Secondary | ICD-10-CM | POA: Diagnosis not present

## 2015-10-10 ENCOUNTER — Ambulatory Visit (AMBULATORY_SURGERY_CENTER): Payer: Self-pay | Admitting: *Deleted

## 2015-10-10 VITALS — Ht 74.0 in | Wt 210.2 lb

## 2015-10-10 DIAGNOSIS — Z86010 Personal history of colonic polyps: Secondary | ICD-10-CM

## 2015-10-10 MED ORDER — NA SULFATE-K SULFATE-MG SULF 17.5-3.13-1.6 GM/177ML PO SOLN: 1.0000 | Freq: Once | ORAL | 0 refills | Status: AC

## 2015-10-10 NOTE — Progress Notes (Signed)
No allergies to eggs or soy. No problems with anesthesia.  Pt not given Emmi instructions for colonoscopy; pt declined  No oxygen use  No diet drug use  

## 2015-10-12 HISTORY — PX: COLONOSCOPY: SHX174

## 2015-10-23 ENCOUNTER — Encounter: Payer: Self-pay | Admitting: Gastroenterology

## 2015-10-23 ENCOUNTER — Ambulatory Visit (AMBULATORY_SURGERY_CENTER): Payer: Medicare Other | Admitting: Gastroenterology

## 2015-10-23 VITALS — BP 141/57 | HR 53 | Temp 97.8°F | Resp 12 | Ht 74.0 in | Wt 211.0 lb

## 2015-10-23 DIAGNOSIS — I10 Essential (primary) hypertension: Secondary | ICD-10-CM | POA: Diagnosis not present

## 2015-10-23 DIAGNOSIS — Z86010 Personal history of colonic polyps: Secondary | ICD-10-CM

## 2015-10-23 DIAGNOSIS — K5730 Diverticulosis of large intestine without perforation or abscess without bleeding: Secondary | ICD-10-CM | POA: Diagnosis not present

## 2015-10-23 DIAGNOSIS — D122 Benign neoplasm of ascending colon: Secondary | ICD-10-CM

## 2015-10-23 DIAGNOSIS — E7800 Pure hypercholesterolemia, unspecified: Secondary | ICD-10-CM | POA: Diagnosis not present

## 2015-10-23 MED ORDER — SODIUM CHLORIDE 0.9 % IV SOLN: 500.0000 mL | INTRAVENOUS | Status: DC

## 2015-10-23 NOTE — Progress Notes (Signed)
Called to room to assist during endoscopic procedure.  Patient ID and intended procedure confirmed with present staff. Received instructions for my participation in the procedure from the performing physician.  

## 2015-10-23 NOTE — Progress Notes (Signed)
No problems noted in the recovery room. maw 

## 2015-10-23 NOTE — Progress Notes (Signed)
Patient awakening,vss,report to rn 

## 2015-10-23 NOTE — Patient Instructions (Signed)
YOU HAD AN ENDOSCOPIC PROCEDURE TODAY AT [facility]:   Refer to the procedure report that was given to you for any specific questions about what was found during the examination.  If the procedure report does not answer your questions, please call your gastroenterologist to clarify.  If you requested that your care partner not be given the details of your procedure findings, then the procedure report has been included in a sealed envelope for you to review at your convenience later.  YOU SHOULD EXPECT: Some feelings of bloating in the abdomen. Passage of more gas than usual.  Walking can help get rid of the air that was put into your GI tract during the procedure and reduce the bloating. If you had a lower endoscopy (such as a colonoscopy or flexible sigmoidoscopy) you may notice spotting of blood in your stool or on the toilet paper. If you underwent a bowel prep for your procedure, you may not have a normal bowel movement for a few days.  Please Note:  You might notice some irritation and congestion in your nose or some drainage.  This is from the oxygen used during your procedure.  There is no need for concern and it should clear up in a day or so.  SYMPTOMS TO REPORT IMMEDIATELY:   Following lower endoscopy (colonoscopy or flexible sigmoidoscopy):  Excessive amounts of blood in the stool  Significant tenderness or worsening of abdominal pains  Swelling of the abdomen that is new, acute  Fever of 100F or higher   Following upper endoscopy (EGD)  Vomiting of blood or coffee ground material  New chest pain or pain under the shoulder blades  Painful or persistently difficult swallowing  New shortness of breath  Fever of 100F or higher  Black, tarry-looking stools  For urgent or emergent issues, a gastroenterologist can be reached at any hour by calling [phone].   DIET:  We do recommend a small meal at first, but then you may proceed to your regular diet.  Drink  plenty of fluids but you should avoid alcoholic beverages for 24 hours.  ACTIVITY:  You should plan to take it easy for the rest of today and you should NOT DRIVE or use heavy machinery until tomorrow (because of the sedation medicines used during the test).    FOLLOW UP: Our staff will call the number listed on your records the next business day following your procedure to check on you and address any questions or concerns that you may have regarding the information given to you following your procedure. If we do not reach you, we will leave a message.  However, if you are feeling well and you are not experiencing any problems, there is no need to return our call.  We will assume that you have returned to your regular daily activities without incident.  If any biopsies were taken you will be contacted by phone or by letter within the next 1-3 weeks.  Please call us at [phone] if you have not heard about the biopsies in 3 weeks.    SIGNATURES/CONFIDENTIALITY: You and/or your care partner have signed paperwork which will be entered into your electronic medical record.  These signatures attest to the fact that that the information above on your After Visit Summary has been reviewed and is understood.  Full responsibility of the confidentiality of this discharge information lies with you and/or your care-partner.   Handouts were given to your care partner on polyps and  diverticulosis. No aspirin, aspirin products,  ibuprofen, naproxen, advil, motrin, aleve, or other non-steroidal anti-inflammatory drugs for 10 days after polyp removal. You may resume your other current medications today. Await biopsy results. Please call if any questions or concerns.

## 2015-10-23 NOTE — Op Note (Signed)
[facility] Patient Name: Stephen Roy Procedure Date: 10/23/2015 9:44 AM MRN: [id number] Endoscopist: Milus Banister , MD Age: 70 Referring MD:  Date of Birth: [birth year] Gender: Male Account #: [id number] Procedure:                Colonoscopy Indications:              High risk colon cancer surveillance: Personal                            history of colonic polyps Colonoscopy 11/2006 Dr.                            Loreli [facility]; done for                            "had colonoscopy several years ago with removal of                            some polyps." Findings: a "minute polyp", sigmoid                            diverticulosis. Polyp was a tubular adenoma on                            pathology. He was recommended to have repeat                            colonoscopy in 3 years. Colonoscopy 10/2009 Dr.                            Margart Sickles, same location; done for "patient had an                            adenoma removed 3 years ago and now presents for                            follow up." Findings; diverticulosis. Recommended                            "colonoscopy in 4-5 years would be indicated" Medicines:                Monitored Anesthesia Care Procedure:                Pre-Anesthesia Assessment:                           - Prior to the procedure, a History and Physical                            was performed, and patient medications and                            allergies were reviewed. The patient's tolerance of  previous anesthesia was also reviewed. The risks                            and benefits of the procedure and the sedation                            options and risks were discussed with the patient.                            All questions were answered, and informed consent                            was obtained. Prior Anticoagulants: The patient has                            taken no  previous anticoagulant or antiplatelet                            agents. ASA Grade Assessment: II - A patient with                            mild systemic disease. After reviewing the risks                            and benefits, the patient was deemed in                            satisfactory condition to undergo the procedure.                           After obtaining informed consent, the colonoscope                            was passed under direct vision. Throughout the                            procedure, the patient's blood pressure, pulse, and                            oxygen saturations were monitored continuously. The                            Model CF-HQ190L [id number]) scope was introduced                            through the anus and advanced to the the cecum,                            identified by appendiceal orifice and ileocecal                            valve. The colonoscopy was performed without  difficulty. The patient tolerated the procedure                            well. The quality of the bowel preparation was                            excellent. The ileocecal valve, appendiceal                            orifice, and rectum were photographed. Scope In: 10:17:48 AM Scope Out: 10:30:27 AM Scope Withdrawal Time: 0 hours 9 minutes 31 seconds  Total Procedure Duration: 0 hours 12 minutes 39 seconds  Findings:                 A 11 mm polyp was found in the ascending colon. The                            polyp was sessile. The polyp was removed with a hot                            snare. Resection and retrieval were complete.                           A few small-mouthed diverticula were found in the                            left colon.                           The exam was otherwise without abnormality on                            direct and retroflexion views. Complications:            No immediate complications. Estimated  blood loss:                            None. Estimated Blood Loss:     Estimated blood loss: none. Impression:               - One 11 mm polyp in the ascending colon, removed                            with a hot snare. Resected and retrieved.                           - Diverticulosis in the left colon.                           - The examination was otherwise normal on direct                            and retroflexion views. Recommendation:           - Patient has a contact number available for  emergencies. The signs and symptoms of potential                            delayed complications were discussed with the                            patient. Return to normal activities tomorrow.                            Written discharge instructions were provided to the                            patient.                           - Resume previous diet.                           - Continue present medications. Avoid ASA and                            NSAIDs for 10 days.                           You will receive a letter within 2-3 weeks with the                            pathology results and my final recommendations.                           If the polyp(s) is proven to be 'pre-cancerous' on                            pathology, you will need repeat colonoscopy in 3                            years. Milus Banister, MD 10/23/2015 10:34:15 AM This report has been signed electronically.

## 2015-10-24 ENCOUNTER — Telehealth: Payer: Self-pay | Admitting: *Deleted

## 2015-10-24 NOTE — Telephone Encounter (Signed)
No answer, left message to call if questions or concerns. 

## 2015-10-28 ENCOUNTER — Encounter: Payer: Self-pay | Admitting: Gastroenterology

## 2015-11-27 DIAGNOSIS — M5432 Sciatica, left side: Secondary | ICD-10-CM | POA: Diagnosis not present

## 2015-11-27 DIAGNOSIS — M9905 Segmental and somatic dysfunction of pelvic region: Secondary | ICD-10-CM | POA: Diagnosis not present

## 2015-11-27 DIAGNOSIS — M9902 Segmental and somatic dysfunction of thoracic region: Secondary | ICD-10-CM | POA: Diagnosis not present

## 2015-11-27 DIAGNOSIS — M47814 Spondylosis without myelopathy or radiculopathy, thoracic region: Secondary | ICD-10-CM | POA: Diagnosis not present

## 2015-11-27 DIAGNOSIS — M9903 Segmental and somatic dysfunction of lumbar region: Secondary | ICD-10-CM | POA: Diagnosis not present

## 2015-11-27 DIAGNOSIS — M4716 Other spondylosis with myelopathy, lumbar region: Secondary | ICD-10-CM | POA: Diagnosis not present

## 2015-11-29 DIAGNOSIS — M47814 Spondylosis without myelopathy or radiculopathy, thoracic region: Secondary | ICD-10-CM | POA: Diagnosis not present

## 2015-11-29 DIAGNOSIS — M9905 Segmental and somatic dysfunction of pelvic region: Secondary | ICD-10-CM | POA: Diagnosis not present

## 2015-11-29 DIAGNOSIS — M4716 Other spondylosis with myelopathy, lumbar region: Secondary | ICD-10-CM | POA: Diagnosis not present

## 2015-11-29 DIAGNOSIS — M5432 Sciatica, left side: Secondary | ICD-10-CM | POA: Diagnosis not present

## 2015-11-29 DIAGNOSIS — M9903 Segmental and somatic dysfunction of lumbar region: Secondary | ICD-10-CM | POA: Diagnosis not present

## 2015-11-29 DIAGNOSIS — M9902 Segmental and somatic dysfunction of thoracic region: Secondary | ICD-10-CM | POA: Diagnosis not present

## 2015-12-05 DIAGNOSIS — M47814 Spondylosis without myelopathy or radiculopathy, thoracic region: Secondary | ICD-10-CM | POA: Diagnosis not present

## 2015-12-05 DIAGNOSIS — M9905 Segmental and somatic dysfunction of pelvic region: Secondary | ICD-10-CM | POA: Diagnosis not present

## 2015-12-05 DIAGNOSIS — M4716 Other spondylosis with myelopathy, lumbar region: Secondary | ICD-10-CM | POA: Diagnosis not present

## 2015-12-05 DIAGNOSIS — M9902 Segmental and somatic dysfunction of thoracic region: Secondary | ICD-10-CM | POA: Diagnosis not present

## 2015-12-05 DIAGNOSIS — M5432 Sciatica, left side: Secondary | ICD-10-CM | POA: Diagnosis not present

## 2015-12-05 DIAGNOSIS — M9903 Segmental and somatic dysfunction of lumbar region: Secondary | ICD-10-CM | POA: Diagnosis not present

## 2015-12-25 DIAGNOSIS — M9905 Segmental and somatic dysfunction of pelvic region: Secondary | ICD-10-CM | POA: Diagnosis not present

## 2015-12-25 DIAGNOSIS — M9902 Segmental and somatic dysfunction of thoracic region: Secondary | ICD-10-CM | POA: Diagnosis not present

## 2015-12-25 DIAGNOSIS — M9901 Segmental and somatic dysfunction of cervical region: Secondary | ICD-10-CM | POA: Diagnosis not present

## 2015-12-25 DIAGNOSIS — M9903 Segmental and somatic dysfunction of lumbar region: Secondary | ICD-10-CM | POA: Diagnosis not present

## 2015-12-26 ENCOUNTER — Ambulatory Visit (INDEPENDENT_AMBULATORY_CARE_PROVIDER_SITE_OTHER): Payer: Medicare Other | Admitting: Family Medicine

## 2015-12-26 ENCOUNTER — Ambulatory Visit (INDEPENDENT_AMBULATORY_CARE_PROVIDER_SITE_OTHER): Payer: Medicare Other

## 2015-12-26 VITALS — BP 128/80 | HR 68 | Temp 99.0°F | Resp 17 | Ht 74.0 in | Wt 211.0 lb

## 2015-12-26 DIAGNOSIS — M62838 Other muscle spasm: Secondary | ICD-10-CM | POA: Diagnosis not present

## 2015-12-26 DIAGNOSIS — M542 Cervicalgia: Secondary | ICD-10-CM

## 2015-12-26 NOTE — Progress Notes (Signed)
Stephen Roy is a 70 y.o. male who presents to [facility] today for neck pain:   1.  Neck pain:  Bilateral neck pain.  Started  5-6 days ago.  Initially, was having low back pain after improperly lifting something this past Thursday.  He lay on the bed for 1.5 hours with ice on his back the next day. The day after this, he began having stiffness and pain in his neck from laying on his stomach with ice on his back.  He describes tightness in his bilateral paracervical muscles. He has been taking Aleve with some relief. He also attended a chiropractic clinic yesterday where he had manipulation and massage and acupuncture. States this really does help. However he wanted to come today to make sure nothing else is going on.  Of note in August or September of this year he did have pain and neck stiffness after white-water rafting. No actually trauma or injury then, just stiffness after rafting.  He deemed this also to be muscle spasm.  He's had no numbness and tingling in his upper extremities. No weakness bilateral upper extremity. No pain, numbness, tingling or weakness in bilateral lower extremities or feet. His lower back is no longer bothersome.  ROS as above.    PMH reviewed. Patient is a nonsmoker.   Past Medical History:  Diagnosis Date  . Hyperlipidemia   . Hypertension    Past Surgical History:  Procedure Laterality Date  . CLOSED REDUCTION HAND FRACTURE Right 1961  . FRACTURE SURGERY Left 2001   shoulder  . TONSILLECTOMY  10/1949    Medications reviewed. Current Outpatient Prescriptions  Medication Sig Dispense Refill  . aspirin 81 MG chewable tablet Chew by mouth daily.    Marland Kitchen atorvastatin (LIPITOR) 10 MG tablet Take 10 mg by mouth daily.    Marland Kitchen glucosamine-chondroitin 500-400 MG tablet Take 1 tablet by mouth daily.    . irbesartan (AVAPRO) 150 MG tablet Take 150 mg by mouth at bedtime.    . Multiple Vitamin (MULTIVITAMIN) tablet Take 1 tablet by mouth  daily.    . Nutritional Supplements (HRT SUPPORT PO) Take by mouth daily.    . Omega-3 Fatty Acids (FISH OIL PO) Take by mouth daily.     Current Facility-Administered Medications  Medication Dose Route Frequency Provider Last Rate Last Dose  . 0.9 %  sodium chloride infusion  500 mL Intravenous Continuous Milus Banister, MD         Physical Exam:  BP 128/80 (BP Location: Right Arm, Patient Position: Sitting, Cuff Size: Normal)   Pulse 68   Temp 99 F (37.2 C) (Oral)   Resp 17   Ht 6\' 2"  (1.88 m)   Wt 211 lb (95.7 kg)   SpO2 97%   BMI 27.09 kg/m  Gen:  Alert, cooperative patient who appears stated age in no acute distress.  Vital signs reviewed. HEENT: EOMI,  MMM Pulm:  Clear to auscultation bilaterally with good air movement.  No wheezes or rales noted.   Cardiac:  Regular rate and rhythm without murmur auscultated.  Good S1/S2. Abd:  Soft/nondistended/nontender.  Good bowel sounds throughout all four quadrants.  No masses noted.  Exts: Non edematous BL  LE, warm and well perfused.   Assessment and Plan:  1.  Neck spasm: - secondary to lying in prolonged position with neck to one side.  Sounds like this is artery getting better. -He should continue with anti-inflammatories and analgesics as he  has been doing. -He should also follow-up with chiropractor as this seemed to help. Massage and heat would be very helpful. -Of note he does been 5 months of the year and [place] skiing. He is leaving in the next one half weeks and wants to ensure he is safe to go skiing. -Because of this we did obtain neck x-rays which were negative for any fracture or other issue. Did show some foraminal narrowing. I went over his x-rays with him. -Follow-up as needed before he heads out of town. Otherwise he should continue with symptomatically and massage/manipulation as needed.

## 2015-12-26 NOTE — Patient Instructions (Addendum)
It was good to see you today!  You do have a muscle spasm in your neck. This should get better over time. Continue using the massage, heat, and activity is you have been to get better more quickly.  Take the Alleve as you have been doing as pain reliever and anti-inflammatory as well.  Enjoy your time in [place]!    IF you received an x-ray today, you will receive an invoice from [facility]. Please contact [facility] at [phone] with questions or concerns regarding your invoice.   IF you received labwork today, you will receive an invoice from [employer]. Please contact [facility] at [phone] with questions or concerns regarding your invoice.   Our billing staff will not be able to assist you with questions regarding bills from these companies.  You will be contacted with the lab results as soon as they are available. The fastest way to get your results is to activate your My Chart account. Instructions are located on the last page of this paperwork. If you have not heard from [place] regarding the results in 2 weeks, please contact this office.

## 2015-12-27 DIAGNOSIS — M9901 Segmental and somatic dysfunction of cervical region: Secondary | ICD-10-CM | POA: Diagnosis not present

## 2015-12-27 DIAGNOSIS — M9902 Segmental and somatic dysfunction of thoracic region: Secondary | ICD-10-CM | POA: Diagnosis not present

## 2015-12-27 DIAGNOSIS — M9903 Segmental and somatic dysfunction of lumbar region: Secondary | ICD-10-CM | POA: Diagnosis not present

## 2015-12-27 DIAGNOSIS — M9905 Segmental and somatic dysfunction of pelvic region: Secondary | ICD-10-CM | POA: Diagnosis not present

## 2016-01-18 DIAGNOSIS — Z23 Encounter for immunization: Secondary | ICD-10-CM | POA: Diagnosis not present

## 2016-04-11 DIAGNOSIS — L578 Other skin changes due to chronic exposure to nonionizing radiation: Secondary | ICD-10-CM | POA: Diagnosis not present

## 2016-04-11 DIAGNOSIS — L0889 Other specified local infections of the skin and subcutaneous tissue: Secondary | ICD-10-CM | POA: Diagnosis not present

## 2016-04-11 DIAGNOSIS — D225 Melanocytic nevi of trunk: Secondary | ICD-10-CM | POA: Diagnosis not present

## 2016-04-11 DIAGNOSIS — L570 Actinic keratosis: Secondary | ICD-10-CM | POA: Diagnosis not present

## 2016-04-11 DIAGNOSIS — B078 Other viral warts: Secondary | ICD-10-CM | POA: Diagnosis not present

## 2016-04-11 DIAGNOSIS — L821 Other seborrheic keratosis: Secondary | ICD-10-CM | POA: Diagnosis not present

## 2016-04-11 DIAGNOSIS — L84 Corns and callosities: Secondary | ICD-10-CM | POA: Diagnosis not present

## 2016-08-20 DIAGNOSIS — Z125 Encounter for screening for malignant neoplasm of prostate: Secondary | ICD-10-CM | POA: Diagnosis not present

## 2016-08-20 DIAGNOSIS — E784 Other hyperlipidemia: Secondary | ICD-10-CM | POA: Diagnosis not present

## 2016-08-20 DIAGNOSIS — I10 Essential (primary) hypertension: Secondary | ICD-10-CM | POA: Diagnosis not present

## 2016-08-25 DIAGNOSIS — Z1212 Encounter for screening for malignant neoplasm of rectum: Secondary | ICD-10-CM | POA: Diagnosis not present

## 2016-08-25 DIAGNOSIS — Z1389 Encounter for screening for other disorder: Secondary | ICD-10-CM | POA: Diagnosis not present

## 2016-08-25 DIAGNOSIS — I10 Essential (primary) hypertension: Secondary | ICD-10-CM | POA: Diagnosis not present

## 2016-08-25 DIAGNOSIS — Z6827 Body mass index (BMI) 27.0-27.9, adult: Secondary | ICD-10-CM | POA: Diagnosis not present

## 2016-08-25 DIAGNOSIS — Z0000 Encounter for general adult medical examination without abnormal findings: Secondary | ICD-10-CM | POA: Diagnosis not present

## 2016-08-25 DIAGNOSIS — E784 Other hyperlipidemia: Secondary | ICD-10-CM | POA: Diagnosis not present

## 2016-08-25 DIAGNOSIS — Z23 Encounter for immunization: Secondary | ICD-10-CM | POA: Diagnosis not present

## 2016-09-15 DIAGNOSIS — H2513 Age-related nuclear cataract, bilateral: Secondary | ICD-10-CM | POA: Diagnosis not present

## 2016-09-15 DIAGNOSIS — H524 Presbyopia: Secondary | ICD-10-CM | POA: Diagnosis not present

## 2016-11-21 DIAGNOSIS — Z23 Encounter for immunization: Secondary | ICD-10-CM | POA: Diagnosis not present

## 2017-03-05 DIAGNOSIS — D492 Neoplasm of unspecified behavior of bone, soft tissue, and skin: Secondary | ICD-10-CM | POA: Diagnosis not present

## 2017-03-05 DIAGNOSIS — L578 Other skin changes due to chronic exposure to nonionizing radiation: Secondary | ICD-10-CM | POA: Diagnosis not present

## 2017-03-05 DIAGNOSIS — L820 Inflamed seborrheic keratosis: Secondary | ICD-10-CM | POA: Diagnosis not present

## 2017-03-05 DIAGNOSIS — L821 Other seborrheic keratosis: Secondary | ICD-10-CM | POA: Diagnosis not present

## 2017-03-05 DIAGNOSIS — L538 Other specified erythematous conditions: Secondary | ICD-10-CM | POA: Diagnosis not present

## 2017-03-05 DIAGNOSIS — D225 Melanocytic nevi of trunk: Secondary | ICD-10-CM | POA: Diagnosis not present

## 2017-03-05 DIAGNOSIS — L570 Actinic keratosis: Secondary | ICD-10-CM | POA: Diagnosis not present

## 2017-03-05 DIAGNOSIS — Z789 Other specified health status: Secondary | ICD-10-CM | POA: Diagnosis not present

## 2017-04-07 IMAGING — DX DG CERVICAL SPINE COMPLETE 4+V
6 series · 6 of 6 positions shown · non-contrast
Comparison: None.

CLINICAL DATA: Neck pain for 5 days.

EXAM:
CERVICAL SPINE - COMPLETE 4+ VIEW

[c-spine lat]
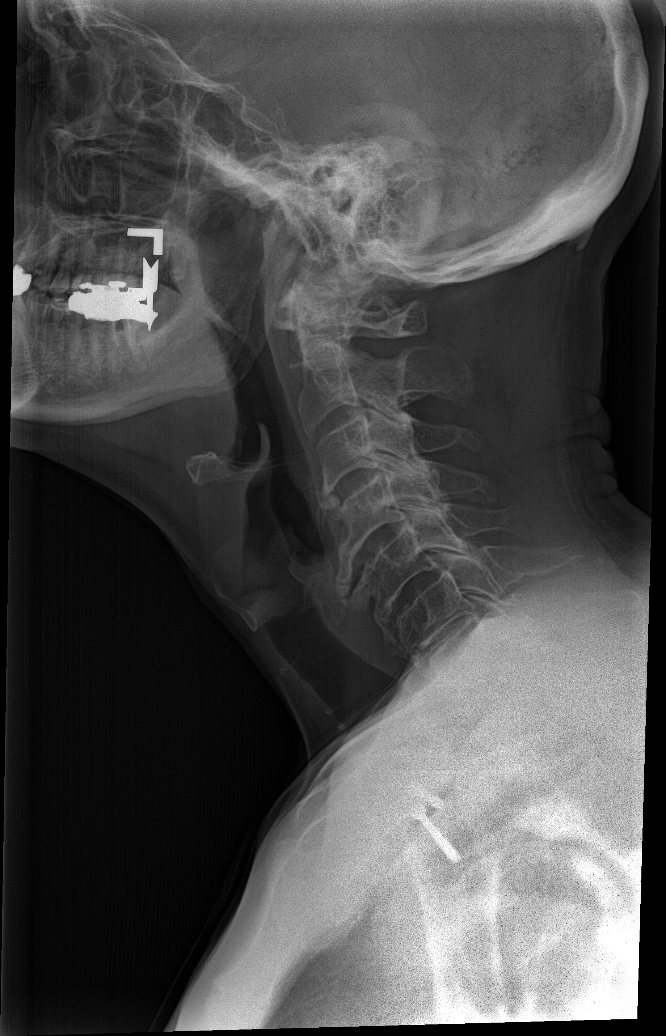

[c-spine obl (1 of 2)]
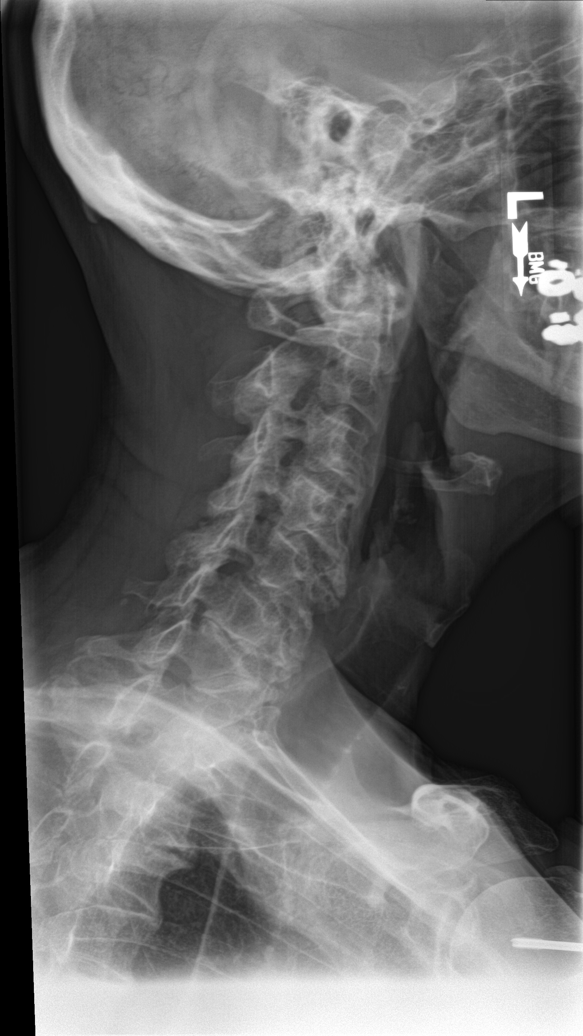

[c-spine obl (2 of 2)]
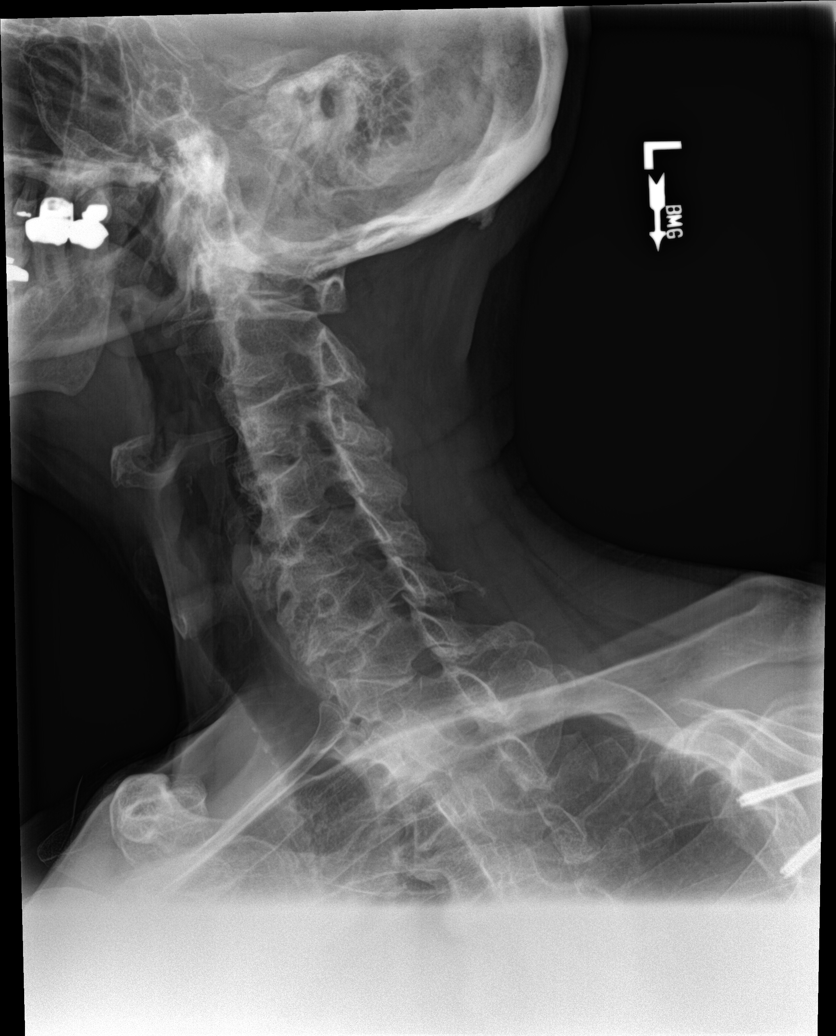

[c-spine ap]
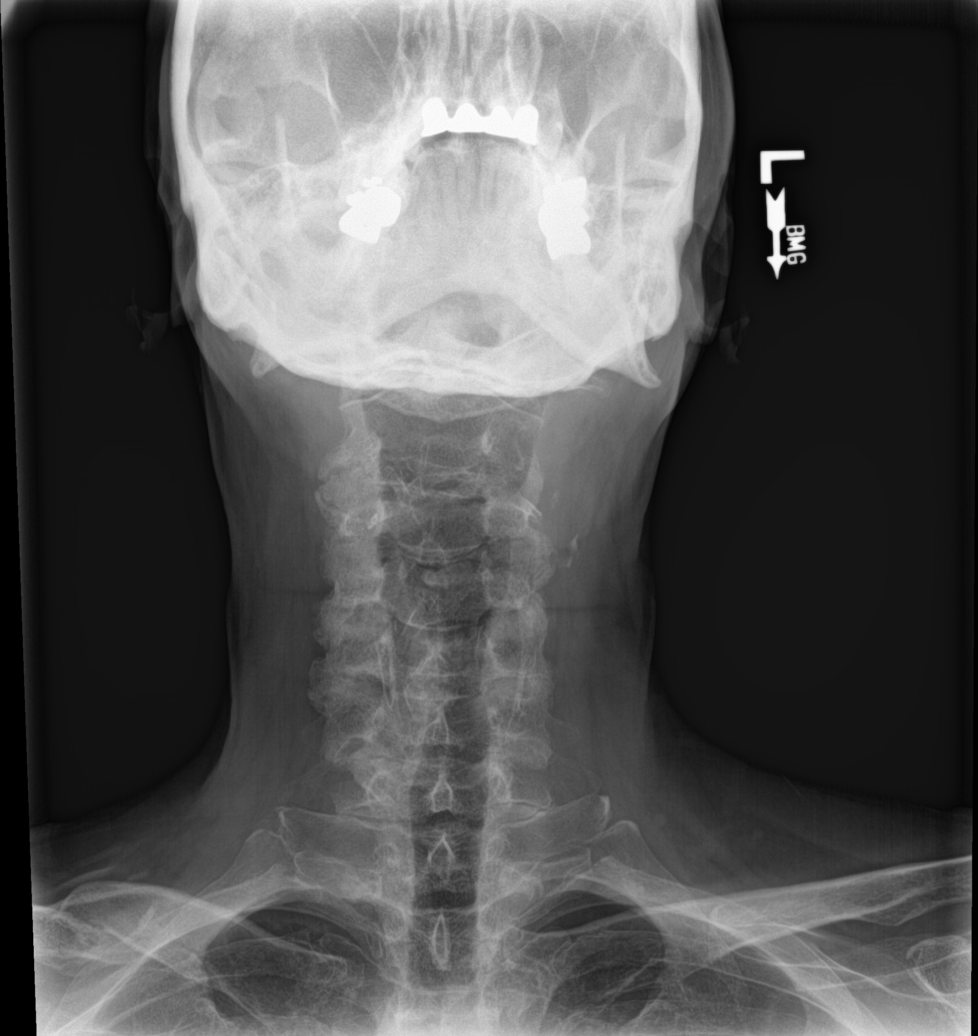

[c-spine open mouth]
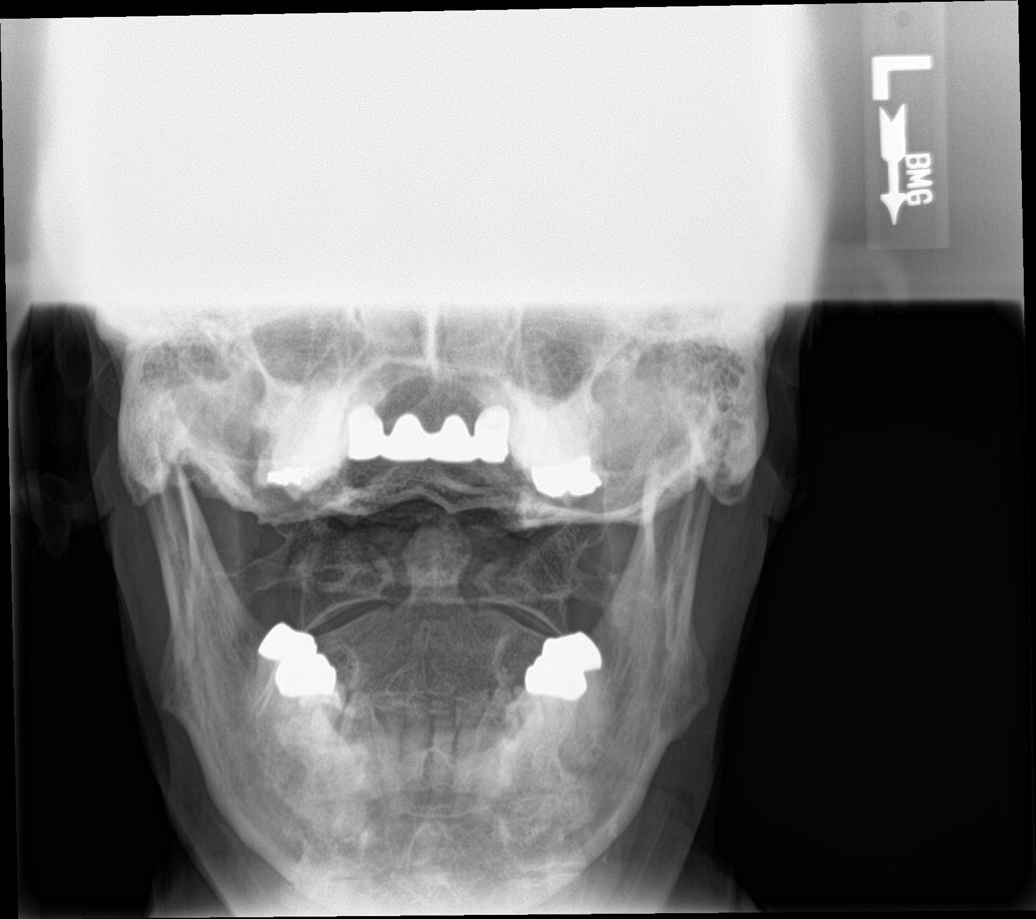

[c-spine swimmers]
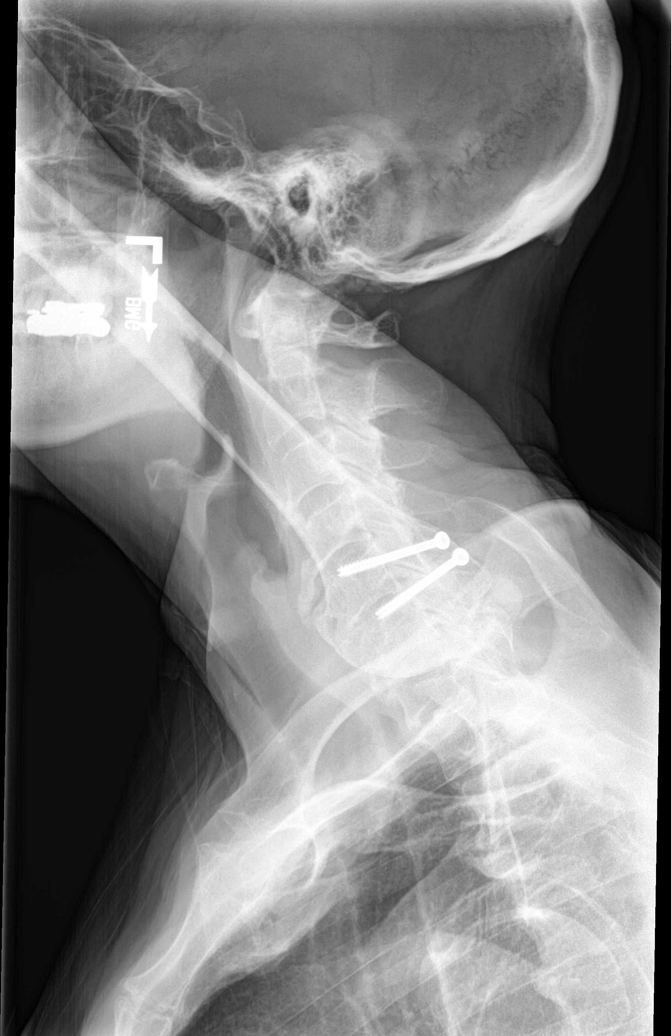

[6 of 6 positions shown; findings below may reference images not displayed]

FINDINGS: There is no evidence of cervical spine fracture or prevertebral soft
tissue swelling. Trace anterolisthesis C5-6. Advanced disc space
narrowing C6-7. BILATERAL foraminal narrowing due to uncinate
spurring is greatest at C6-7, worse on the RIGHT at C5-6, BILATERAL
at C3-4, and predominantly on the RIGHT at C4-5. Negative odontoid.
Previous surgery LEFT humerus.
IMPRESSION: Spondylosis.  No acute findings.

## 2017-08-24 DIAGNOSIS — R82998 Other abnormal findings in urine: Secondary | ICD-10-CM | POA: Diagnosis not present

## 2017-08-24 DIAGNOSIS — E7849 Other hyperlipidemia: Secondary | ICD-10-CM | POA: Diagnosis not present

## 2017-08-24 DIAGNOSIS — Z125 Encounter for screening for malignant neoplasm of prostate: Secondary | ICD-10-CM | POA: Diagnosis not present

## 2017-08-24 DIAGNOSIS — I10 Essential (primary) hypertension: Secondary | ICD-10-CM | POA: Diagnosis not present

## 2017-08-31 DIAGNOSIS — Z6827 Body mass index (BMI) 27.0-27.9, adult: Secondary | ICD-10-CM | POA: Diagnosis not present

## 2017-08-31 DIAGNOSIS — I10 Essential (primary) hypertension: Secondary | ICD-10-CM | POA: Diagnosis not present

## 2017-08-31 DIAGNOSIS — E7849 Other hyperlipidemia: Secondary | ICD-10-CM | POA: Diagnosis not present

## 2017-08-31 DIAGNOSIS — Z0000 Encounter for general adult medical examination without abnormal findings: Secondary | ICD-10-CM | POA: Diagnosis not present

## 2017-08-31 DIAGNOSIS — Z1389 Encounter for screening for other disorder: Secondary | ICD-10-CM | POA: Diagnosis not present

## 2017-08-31 DIAGNOSIS — Z125 Encounter for screening for malignant neoplasm of prostate: Secondary | ICD-10-CM | POA: Diagnosis not present

## 2017-08-31 DIAGNOSIS — N528 Other male erectile dysfunction: Secondary | ICD-10-CM | POA: Diagnosis not present

## 2017-09-01 DIAGNOSIS — Z1212 Encounter for screening for malignant neoplasm of rectum: Secondary | ICD-10-CM | POA: Diagnosis not present

## 2017-09-03 ENCOUNTER — Other Ambulatory Visit: Payer: Self-pay

## 2017-09-03 ENCOUNTER — Encounter (HOSPITAL_COMMUNITY): Payer: Self-pay | Admitting: Emergency Medicine

## 2017-09-03 ENCOUNTER — Observation Stay (HOSPITAL_COMMUNITY): Payer: Medicare Other

## 2017-09-03 ENCOUNTER — Emergency Department (HOSPITAL_COMMUNITY): Payer: Medicare Other

## 2017-09-03 ENCOUNTER — Observation Stay (HOSPITAL_COMMUNITY)
Admission: EM | Admit: 2017-09-03 | Discharge: 2017-09-04 | Disposition: A | Discharge status: Home / Self Care | Payer: Medicare Other | Attending: Internal Medicine | Admitting: Internal Medicine

## 2017-09-03 ENCOUNTER — Ambulatory Visit (HOSPITAL_BASED_OUTPATIENT_CLINIC_OR_DEPARTMENT_OTHER): Payer: Medicare Other

## 2017-09-03 DIAGNOSIS — Z9889 Other specified postprocedural states: Secondary | ICD-10-CM | POA: Insufficient documentation

## 2017-09-03 DIAGNOSIS — R9431 Abnormal electrocardiogram [ECG] [EKG]: Secondary | ICD-10-CM | POA: Diagnosis present

## 2017-09-03 DIAGNOSIS — I351 Nonrheumatic aortic (valve) insufficiency: Secondary | ICD-10-CM | POA: Diagnosis not present

## 2017-09-03 DIAGNOSIS — Z79899 Other long term (current) drug therapy: Secondary | ICD-10-CM | POA: Insufficient documentation

## 2017-09-03 DIAGNOSIS — Z7982 Long term (current) use of aspirin: Secondary | ICD-10-CM | POA: Insufficient documentation

## 2017-09-03 DIAGNOSIS — E785 Hyperlipidemia, unspecified: Secondary | ICD-10-CM | POA: Diagnosis present

## 2017-09-03 DIAGNOSIS — I420 Dilated cardiomyopathy: Secondary | ICD-10-CM | POA: Insufficient documentation

## 2017-09-03 DIAGNOSIS — I10 Essential (primary) hypertension: Secondary | ICD-10-CM | POA: Diagnosis not present

## 2017-09-03 DIAGNOSIS — R42 Dizziness and giddiness: Secondary | ICD-10-CM | POA: Diagnosis not present

## 2017-09-03 DIAGNOSIS — I6782 Cerebral ischemia: Secondary | ICD-10-CM | POA: Diagnosis not present

## 2017-09-03 DIAGNOSIS — Z8249 Family history of ischemic heart disease and other diseases of the circulatory system: Secondary | ICD-10-CM | POA: Diagnosis not present

## 2017-09-03 DIAGNOSIS — Z86010 Personal history of colonic polyps: Secondary | ICD-10-CM | POA: Diagnosis not present

## 2017-09-03 DIAGNOSIS — R001 Bradycardia, unspecified: Secondary | ICD-10-CM | POA: Diagnosis not present

## 2017-09-03 DIAGNOSIS — I495 Sick sinus syndrome: Secondary | ICD-10-CM | POA: Diagnosis not present

## 2017-09-03 DIAGNOSIS — R079 Chest pain, unspecified: Secondary | ICD-10-CM | POA: Diagnosis not present

## 2017-09-03 DIAGNOSIS — I062 Rheumatic aortic stenosis with insufficiency: Secondary | ICD-10-CM | POA: Diagnosis not present

## 2017-09-03 DIAGNOSIS — R0789 Other chest pain: Secondary | ICD-10-CM | POA: Diagnosis not present

## 2017-09-03 HISTORY — DX: Abnormal electrocardiogram (ECG) (EKG): R94.31

## 2017-09-03 HISTORY — DX: Bradycardia, unspecified: R00.1

## 2017-09-03 HISTORY — DX: Dizziness and giddiness: R42

## 2017-09-03 LAB — CBC WITH DIFFERENTIAL/PLATELET
ABS IMMATURE GRANULOCYTES: 0 10*3/uL (ref 0.0–0.1)
BASOS ABS: 0 10*3/uL (ref 0.0–0.1)
BASOS PCT: 0 %
EOS ABS: 0.1 10*3/uL (ref 0.0–0.7)
Eosinophils Relative: 2 %
HCT: 37.4 % — ABNORMAL LOW (ref 39.0–52.0)
Hemoglobin: 12.4 g/dL — ABNORMAL LOW (ref 13.0–17.0)
Immature Granulocytes: 0 %
Lymphocytes Relative: 27 %
Lymphs Abs: 1.3 10*3/uL (ref 0.7–4.0)
MCH: 31.4 pg (ref 26.0–34.0)
MCHC: 33.2 g/dL (ref 30.0–36.0)
MCV: 94.7 fL (ref 78.0–100.0)
MONOS PCT: 8 %
Monocytes Absolute: 0.4 10*3/uL (ref 0.1–1.0)
NEUTROS PCT: 63 %
Neutro Abs: 3 10*3/uL (ref 1.7–7.7)
Platelets: 154 10*3/uL (ref 150–400)
RBC: 3.95 MIL/uL — ABNORMAL LOW (ref 4.22–5.81)
RDW: 12.5 % (ref 11.5–15.5)
WBC: 4.7 10*3/uL (ref 4.0–10.5)

## 2017-09-03 LAB — I-STAT TROPONIN, ED: TROPONIN I, POC: 0.02 ng/mL (ref 0.00–0.08)

## 2017-09-03 LAB — COMPREHENSIVE METABOLIC PANEL
ALK PHOS: 27 U/L — AB (ref 38–126)
ALT: 35 U/L (ref 0–44)
AST: 31 U/L (ref 15–41)
Albumin: 3.7 g/dL (ref 3.5–5.0)
Anion gap: 7 (ref 5–15)
BILIRUBIN TOTAL: 0.8 mg/dL (ref 0.3–1.2)
BUN: 15 mg/dL (ref 8–23)
CALCIUM: 9.2 mg/dL (ref 8.9–10.3)
CO2: 25 mmol/L (ref 22–32)
CREATININE: 1.1 mg/dL (ref 0.61–1.24)
Chloride: 107 mmol/L (ref 98–111)
Glucose, Bld: 105 mg/dL — ABNORMAL HIGH (ref 70–99)
Potassium: 4.2 mmol/L (ref 3.5–5.1)
SODIUM: 139 mmol/L (ref 135–145)
TOTAL PROTEIN: 6.2 g/dL — AB (ref 6.5–8.1)

## 2017-09-03 LAB — ECHOCARDIOGRAM COMPLETE

## 2017-09-03 LAB — CBG MONITORING, ED: Glucose-Capillary: 101 mg/dL — ABNORMAL HIGH (ref 70–99)

## 2017-09-03 LAB — TSH: TSH: 0.52 u[IU]/mL (ref 0.350–4.500)

## 2017-09-03 MED ORDER — IRBESARTAN 300 MG PO TABS
150.0000 mg | ORAL_TABLET | Freq: Every morning | ORAL | Status: DC
  Administered 2017-09-04: 150 mg via ORAL
  Filled 2017-09-03: qty 1

## 2017-09-03 MED ORDER — ATORVASTATIN CALCIUM 10 MG PO TABS
10.0000 mg | ORAL_TABLET | Freq: Every day | ORAL | Status: DC
  Administered 2017-09-04: 10 mg via ORAL
  Filled 2017-09-03: qty 1

## 2017-09-03 MED ORDER — SODIUM CHLORIDE 0.9 % IV SOLN
INTRAVENOUS | Status: DC
  Administered 2017-09-03: 15:00:00 via INTRAVENOUS

## 2017-09-03 MED ORDER — HYDROCODONE-ACETAMINOPHEN 5-325 MG PO TABS: 1.0000 | ORAL_TABLET | ORAL | Status: DC | PRN

## 2017-09-03 MED ORDER — ONDANSETRON HCL 4 MG PO TABS: 4.0000 mg | ORAL_TABLET | Freq: Four times a day (QID) | ORAL | Status: DC | PRN

## 2017-09-03 MED ORDER — ONDANSETRON HCL 4 MG/2ML IJ SOLN: 4.0000 mg | Freq: Four times a day (QID) | INTRAMUSCULAR | Status: DC | PRN

## 2017-09-03 MED ORDER — ENOXAPARIN SODIUM 40 MG/0.4ML ~~LOC~~ SOLN
40.0000 mg | SUBCUTANEOUS | Status: DC
  Filled 2017-09-03: qty 0.4

## 2017-09-03 MED ORDER — ACETAMINOPHEN 650 MG RE SUPP: 650.0000 mg | Freq: Four times a day (QID) | RECTAL | Status: DC | PRN

## 2017-09-03 MED ORDER — ASPIRIN 81 MG PO CHEW
81.0000 mg | CHEWABLE_TABLET | Freq: Every day | ORAL | Status: DC
  Administered 2017-09-04: 81 mg via ORAL
  Filled 2017-09-03: qty 1

## 2017-09-03 MED ORDER — TRAZODONE HCL 50 MG PO TABS: 25.0000 mg | ORAL_TABLET | Freq: Every evening | ORAL | Status: DC | PRN

## 2017-09-03 MED ORDER — ACETAMINOPHEN 325 MG PO TABS: 650.0000 mg | ORAL_TABLET | Freq: Four times a day (QID) | ORAL | Status: DC | PRN

## 2017-09-03 MED ORDER — BISACODYL 5 MG PO TBEC: 5.0000 mg | DELAYED_RELEASE_TABLET | Freq: Every day | ORAL | Status: DC | PRN

## 2017-09-03 NOTE — ED Provider Notes (Signed)
[facility]ERGENCY DEPARTMENT Provider Note   CSN: [id number] Arrival date & time: 09/03/17  0907     History   Chief Complaint Chief Complaint  Patient presents with  . 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 male.  The history is provided by the patient. No language interpreter was used.  Dizziness   Yadiel ZEFERINO MOUNTS is a 72 y.o. male who presents to the Emergency Department complaining of dizziness. He presents to the emergency department for evaluation of dizziness that began about 7 AM this morning. When he awoke this morning he was in his routine state of health but shortly thereafter he developed dizziness described as a lightheadedness sensation with associated nausea and light diaphoresis. On EMS arrival he received 4 mg of Zofran and he is feeling partially improved. He denies any headache, chest pain, shortness of breath, vomiting, abdominal pain, diarrhea, numbness. He does endorse generalized weakness. He has a history of hypertension, no additional medical problems. He does not smoke. He drinks two alcoholic drinks daily. No street drug use. He does have a family history of cardiac disease and his brother and father. No recent medical illnesses. He did have a physical on Monday of this week. He did take aspirin prior to ED arrival. Past Medical History:  Diagnosis Date  . Abnormal EKG   . Bradycardia   . Dizziness   . Hyperlipidemia   . Hypertension     Patient Active Problem List   Diagnosis Date Noted  . Hypertension   . Hyperlipidemia   . Dizziness   . Bradycardia   . Abnormal EKG     Past Surgical History:  Procedure Laterality Date  . CLOSED REDUCTION HAND FRACTURE Right 1961  . FRACTURE SURGERY Left 2001   shoulder  . TONSILLECTOMY  10/1949        Home Medications    Prior to Admission medications   Medication Sig Start Date End Date Taking? Authorizing Provider  aspirin 81 MG chewable tablet Chew 81 mg by mouth  daily.    Yes [provider]  atorvastatin (LIPITOR) 10 MG tablet Take 10 mg by mouth daily.   Yes [provider]  glucosamine-chondroitin 500-400 MG tablet Take 1 tablet by mouth daily.   Yes [provider]  irbesartan (AVAPRO) 150 MG tablet Take 150 mg by mouth every morning.    Yes [provider]  Multiple Vitamin (MULTIVITAMIN) tablet Take 1 tablet by mouth daily.   Yes [provider]  Omega-3 Fatty Acids (FISH OIL PO) Take 1 capsule by mouth daily.    Yes [provider]    Family History Family History  Problem Relation Age of Onset  . Heart disease Father   . Colon cancer Neg Hx     Social History Social History   Tobacco Use  . Smoking status: Never Smoker  . Smokeless tobacco: Never Used  Substance Use Topics  . Alcohol use: Yes    Alcohol/week: 3.0 oz    Types: 5 Standard drinks or equivalent per week  . Drug use: No     Allergies   Patient has no known allergies.   Review of Systems Review of Systems  Neurological: Positive for dizziness.  All other systems reviewed and are negative.    Physical Exam Updated Vital Signs BP (!) 155/78 (BP Location: Left Arm)   Pulse (!) 53   Temp 97.7 F (36.5 C) (Oral)   Resp 18  SpO2 99%   Physical Exam  Constitutional: He is oriented to person, place, and time. He appears well-developed and well-nourished.  HENT:  Head: Normocephalic and atraumatic.  TMs clear bilaterally  Eyes: Pupils are equal, round, and reactive to light. EOM are normal.  Cardiovascular: Regular rhythm.  No murmur heard. bradycardic  Pulmonary/Chest: Effort normal and breath sounds normal. No respiratory distress.  Abdominal: Soft. There is no tenderness. There is no rebound and no guarding.  Musculoskeletal: He exhibits no edema or tenderness.  Neurological: He is alert and oriented to person, place, and time.  No nystagmus.  5/5 strength in all four extremities with  Skin:  Skin is warm and dry.  Psychiatric: He has a normal mood and affect. His behavior is normal.  Nursing note and vitals reviewed.    ED Treatments / Results  Labs (all labs ordered are listed, but only abnormal results are displayed) Labs Reviewed  COMPREHENSIVE METABOLIC PANEL - Abnormal; Notable for the following components:      Result Value   Glucose, Bld 105 (*)    Total Protein 6.2 (*)    Alkaline Phosphatase 27 (*)    All other components within normal limits  CBC WITH DIFFERENTIAL/PLATELET - Abnormal; Notable for the following components:   RBC 3.95 (*)    Hemoglobin 12.4 (*)    HCT 37.4 (*)    All other components within normal limits  CBG MONITORING, ED - Abnormal; Notable for the following components:   Glucose-Capillary 101 (*)    All other components within normal limits  TSH  I-STAT TROPONIN, ED    EKG EKG Interpretation  Date/Time:  Thursday September 03 2017 09:19:47 EDT Ventricular Rate:  45 PR Interval:    QRS Duration: 96 QT Interval:  463 QTC Calculation: 401 R Axis:   155 Text Interpretation:  Sinus or ectopic atrial bradycardia RVH with secondary repolarization abnrm Anterolateral infarct, age indeterminate Baseline wander in lead(s) V3 no prior available for comparison Confirmed by Quintella Reichert [id number]) on 09/03/2017 9:40:21 AM   Radiology Dg Chest 2 View  Result Date: 09/03/2017 CLINICAL DATA:  dizziness EXAM: CHEST - 2 VIEW COMPARISON:  None in PACs FINDINGS: The lungs are mildly hyperinflated and clear. The heart and pulmonary vascularity are normal. The mediastinum is normal in width. The trachea is midline. There is calcification in the wall of the aortic arch. There is multilevel degenerative disc disease of the thoracic spine. IMPRESSION: There is no acute cardiopulmonary abnormality. Mild hyperinflation may be voluntary or may reflect underlying chronic bronchitic changes. Electronically Signed   By: David  Martinique M.D.   On: 09/03/2017 10:02   Ct  Head Wo Contrast  Result Date: 09/03/2017 CLINICAL DATA:  Dizziness and nausea EXAM: CT HEAD WITHOUT CONTRAST TECHNIQUE: Contiguous axial images were obtained from the base of the skull through the vertex without intravenous contrast. COMPARISON:  None. FINDINGS: Brain: The ventricles are normal in size and configuration. There is no intracranial mass, hemorrhage, extra-axial fluid collection, or midline shift. There is slight small vessel disease in the centra semiovale bilaterally. Elsewhere gray-white compartments appear normal. There is no evident acute infarct. Vascular: There is no appreciable hyperdense vessel. There is calcification in each carotid siphon region as well as in the distal left vertebral artery. Skull: Bony calvarium appears intact. Sinuses/Orbits: There is mucosal thickening in several ethmoid air cells bilaterally. Other visualized paranasal sinuses are clear. Orbits appear symmetric bilaterally. Other: Mastoid air cells are clear. IMPRESSION: Rather minimal periventricular small  vessel disease. No acute infarct. No mass or hemorrhage. There are foci of arterial vascular calcification. There is mucosal thickening in several ethmoid air cells. Electronically Signed   By: Lowella Grip III M.D.   On: 09/03/2017 10:13   Mr Jodene Nam Head Wo Contrast  Result Date: 09/03/2017 CLINICAL DATA:  72 year old male with sudden onset dizziness and nausea this morning. EXAM: MRI HEAD WITHOUT CONTRAST MRA HEAD WITHOUT CONTRAST TECHNIQUE: Multiplanar, multiecho pulse sequences of the brain and surrounding structures were obtained without intravenous contrast. Angiographic images of the head were obtained using MRA technique without contrast. COMPARISON:  Head CT without contrast 1005 hours today. FINDINGS: MRI HEAD FINDINGS Brain: No restricted diffusion to suggest acute infarction. No midline shift, mass effect, evidence of mass lesion, ventriculomegaly, extra-axial collection or acute intracranial  hemorrhage. Cervicomedullary junction and pituitary are within normal limits. Pearline Cables and white matter signal is largely normal for age throughout the brain. There is minimal to mild nonspecific subcortical white matter T2 and FLAIR hyperintensity in both hemispheres, mostly along the operculum a no encephalomalacia or chronic cerebral blood products identified. Vascular: Major intracranial vascular flow voids are preserved, the distal left vertebral artery appears dominant and there is generalized intracranial artery tortuosity. A non dominant right vertebral artery probably terminates in PICA. Skull and upper cervical spine: Negative visible cervical spine. Visualized bone marrow signal is within normal limits. Incidental small arachnoid granulation along the posterior right foramen magnum (series 8, image 4). Sinuses/Orbits: Normal orbits soft tissues. Paranasal Visualized paranasal sinuses and mastoids are stable and well pneumatized. Other: Visible internal auditory structures appear normal. Scalp and face soft tissues appear negative. MRA HEAD FINDINGS Antegrade flow in the posterior circulation with a dominant left vertebral artery that supplies the basilar. Normal left PICA origin. A diminutive distal right vertebral artery terminates in PICA. Tortuous basilar artery without stenosis. Normal SCA origins. Tortuous but otherwise normal bilateral PCA origins. Posterior communicating arteries are diminutive or absent. Bilateral PCA branches are within normal limits (mild MO TSA artifact suspected in the right P2 segment. Antegrade flow in both ICA siphons. Siphon irregularity and evidence of calcified atherosclerosis without ICA siphon stenosis. Normal ophthalmic artery origins. Patent carotid termini. Normal MCA and ACA origins. Anterior communicating artery and visible ACA branches are within normal limits. The MCA M1 segments are mildly tortuous. Both MCA bifurcations are patent without stenosis. The visible  bilateral MCA branches are within normal limits. IMPRESSION: 1. No acute intracranial abnormality. 2. Essentially normal for age noncontrast MRI appearance of the brain. 3. Negative MRA; intracranial artery tortuosity and large vessel calcified plaque without stenosis. Electronically Signed   By: Genevie Ann M.D.   On: 09/03/2017 13:35   Mr Brain Wo Contrast  Result Date: 09/03/2017 CLINICAL DATA:  72 year old male with sudden onset dizziness and nausea this morning. EXAM: MRI HEAD WITHOUT CONTRAST MRA HEAD WITHOUT CONTRAST TECHNIQUE: Multiplanar, multiecho pulse sequences of the brain and surrounding structures were obtained without intravenous contrast. Angiographic images of the head were obtained using MRA technique without contrast. COMPARISON:  Head CT without contrast 1005 hours today. FINDINGS: MRI HEAD FINDINGS Brain: No restricted diffusion to suggest acute infarction. No midline shift, mass effect, evidence of mass lesion, ventriculomegaly, extra-axial collection or acute intracranial hemorrhage. Cervicomedullary junction and pituitary are within normal limits. Pearline Cables and white matter signal is largely normal for age throughout the brain. There is minimal to mild nonspecific subcortical white matter T2 and FLAIR hyperintensity in both hemispheres, mostly along the operculum a  no encephalomalacia or chronic cerebral blood products identified. Vascular: Major intracranial vascular flow voids are preserved, the distal left vertebral artery appears dominant and there is generalized intracranial artery tortuosity. A non dominant right vertebral artery probably terminates in PICA. Skull and upper cervical spine: Negative visible cervical spine. Visualized bone marrow signal is within normal limits. Incidental small arachnoid granulation along the posterior right foramen magnum (series 8, image 4). Sinuses/Orbits: Normal orbits soft tissues. Paranasal Visualized paranasal sinuses and mastoids are stable and well  pneumatized. Other: Visible internal auditory structures appear normal. Scalp and face soft tissues appear negative. MRA HEAD FINDINGS Antegrade flow in the posterior circulation with a dominant left vertebral artery that supplies the basilar. Normal left PICA origin. A diminutive distal right vertebral artery terminates in PICA. Tortuous basilar artery without stenosis. Normal SCA origins. Tortuous but otherwise normal bilateral PCA origins. Posterior communicating arteries are diminutive or absent. Bilateral PCA branches are within normal limits (mild MO TSA artifact suspected in the right P2 segment. Antegrade flow in both ICA siphons. Siphon irregularity and evidence of calcified atherosclerosis without ICA siphon stenosis. Normal ophthalmic artery origins. Patent carotid termini. Normal MCA and ACA origins. Anterior communicating artery and visible ACA branches are within normal limits. The MCA M1 segments are mildly tortuous. Both MCA bifurcations are patent without stenosis. The visible bilateral MCA branches are within normal limits. IMPRESSION: 1. No acute intracranial abnormality. 2. Essentially normal for age noncontrast MRI appearance of the brain. 3. Negative MRA; intracranial artery tortuosity and large vessel calcified plaque without stenosis. Electronically Signed   By: Genevie Ann M.D.   On: 09/03/2017 13:35    Procedures Procedures (including critical care time)  Medications Ordered in ED Medications  aspirin chewable tablet 81 mg (81 mg Oral Not Given 09/03/17 1127)  atorvastatin (LIPITOR) tablet 10 mg (has no administration in time range)  irbesartan (AVAPRO) tablet 150 mg (has no administration in time range)  enoxaparin (LOVENOX) injection 40 mg (has no administration in time range)  0.9 %  sodium chloride infusion (has no administration in time range)  acetaminophen (TYLENOL) tablet 650 mg (has no administration in time range)    Or  acetaminophen (TYLENOL) suppository 650 mg (has no  administration in time range)  HYDROcodone-acetaminophen (NORCO/VICODIN) 5-325 MG per tablet 1-2 tablet (has no administration in time range)  traZODone (DESYREL) tablet 25 mg (has no administration in time range)  ondansetron (ZOFRAN) tablet 4 mg (has no administration in time range)    Or  ondansetron (ZOFRAN) injection 4 mg (has no administration in time range)  bisacodyl (DULCOLAX) EC tablet 5 mg (has no administration in time range)     Initial Impression / Assessment and Plan / ED Course  I have reviewed the triage vital signs and the nursing notes.  Pertinent labs & imaging results that were available during my care of the patient were reviewed by me and considered in my medical decision making (see chart for details).    patient here for evaluation of dizziness that occurred earlier today. He is bradycardic on examination with no focal neurologic deficits. He does have hypertension, increased from his baseline. EKG abnormal with no priors available for comparison. Given symptoms, hypertension and EKG findings plan to admit for further evaluation and workup.  Final Clinical Impressions(s) / ED Diagnoses   Final diagnoses:  Essential hypertension  Dizziness  Bradycardia  Abnormal EKG  Personal history of colonic polyps    ED Discharge Orders    None  Quintella Reichert, MD 09/03/17 [id number]

## 2017-09-03 NOTE — Consult Note (Addendum)
ELECTROPHYSIOLOGY CONSULT NOTE    Patient ID: SETH HIGGINBOTHAM MRN: [id number], DOB/AGE: Dec 15, 1945 72 y.o.  Admit date: 09/03/2017 Date of Consult: 09/03/2017  Primary Physician: Velna Hatchet, MD Electrophysiologist: Curt Bears (new this admission)  Patient Profile: Stephen Roy is a 72 y.o. male with a history of hypertension and hyperlipidemia who is being seen today for the evaluation of bradycardia at the request of Dyanne Carrel, NP.  HPI:  Stephen ANDERLE is a 72 y.o. male with no significant past cardiac history. He is very active at home playing golf and going to the gym without limitations.  This morning, he got up, made a cup of coffee. He drank coffee and then sat down to do some emails. He suddenly began to feel like "he was looking through a ship's window" and was dizzy and lightheaded. He got up to make himself a sandwich thinking that would help but by the time he got to the kitchen, he was nauseated and didn't have an appetite.  He laid down and continued to feel poorly with ongoing dizziness and lightheadedness. He asked his wife to call EMS. He was brought to [facility] hospital and on admission he was found to be bradycardic with heart rates in the 40's. Because of sudden onset of symptoms, MRI was obtained which was unremarkable.  He had an EKG done at his PCP's office earlier this week which showed sinus brady, rate 53. There are no prior EKG's in the system for comparison. He is currently feeling much improved , dizziness is resolved, nausea is resolved and he just ate lunch.  Echo is currently being done.   He denies chest pain, palpitations, dyspnea, PND, orthopnea, vomiting, syncope, edema, weight gain, or early satiety.  Past Medical History:  Diagnosis Date  . Abnormal EKG   . Bradycardia   . Dizziness   . Hyperlipidemia   . Hypertension      Surgical History:  Past Surgical History:  Procedure Laterality Date  . CLOSED REDUCTION HAND FRACTURE Right 1961  .  FRACTURE SURGERY Left 2001   shoulder  . TONSILLECTOMY  10/1949     Facility-Administered Medications Prior to Admission  Medication Dose Route Frequency Provider Last Rate Last Dose  . 0.9 %  sodium chloride infusion  500 mL Intravenous Continuous Milus Banister, MD       Medications Prior to Admission  Medication Sig Dispense Refill Last Dose  . aspirin 81 MG chewable tablet Chew 81 mg by mouth daily.    09/03/2017 at Unknown time  . atorvastatin (LIPITOR) 10 MG tablet Take 10 mg by mouth daily.   09/03/2017 at Unknown time  . glucosamine-chondroitin 500-400 MG tablet Take 1 tablet by mouth daily.   09/03/2017 at Unknown time  . irbesartan (AVAPRO) 150 MG tablet Take 150 mg by mouth every morning.    09/03/2017 at Unknown time  . Multiple Vitamin (MULTIVITAMIN) tablet Take 1 tablet by mouth daily.   09/03/2017 at Unknown time  . Omega-3 Fatty Acids (FISH OIL PO) Take 1 capsule by mouth daily.    09/03/2017 at Unknown time    Inpatient Medications:  . [START ON 09/04/2017] aspirin  81 mg Oral Daily  . [START ON 09/04/2017] atorvastatin  10 mg Oral Daily  . enoxaparin (LOVENOX) injection  40 mg Subcutaneous Q24H  . [START ON 09/04/2017] irbesartan  150 mg Oral q morning - 10a    Allergies: No Known Allergies  Social History   Socioeconomic History  .  Marital status: Married    Spouse name: Not on file  . Number of children: Not on file  . Years of education: Not on file  . Highest education level: Not on file  Occupational History  . Not on file  Social Needs  . Financial resource strain: Not on file  . Food insecurity:    Worry: Not on file    Inability: Not on file  . Transportation needs:    Medical: Not on file    Non-medical: Not on file  Tobacco Use  . Smoking status: Never Smoker  . Smokeless tobacco: Never Used  Substance and Sexual Activity  . Alcohol use: Yes    Alcohol/week: 3.0 oz    Types: 5 Standard drinks or equivalent per week  . Drug use: No  . Sexual  activity: Not on file  Lifestyle  . Physical activity:    Days per week: Not on file    Minutes per session: Not on file  . Stress: Not on file  Relationships  . Social connections:    Talks on phone: Not on file    Gets together: Not on file    Attends religious service: Not on file    Active member of club or organization: Not on file    Attends meetings of clubs or organizations: Not on file    Relationship status: Not on file  . Intimate partner violence:    Fear of current or ex partner: Not on file    Emotionally abused: Not on file    Physically abused: Not on file    Forced sexual activity: Not on file  Other Topics Concern  . Not on file  Social History Narrative  . Not on file     Family History  Problem Relation Age of Onset  . Heart disease Father   . Colon cancer Neg Hx      Review of Systems: All other systems reviewed and are otherwise negative except as noted above.  Physical Exam: Vitals:   09/03/17 1115 09/03/17 1130 09/03/17 1202 09/03/17 1205  BP: 138/83 (!) 160/88 (!) 185/91 (!) 155/78  Pulse: (!) 48 76 65 (!) 53  Resp: 16 17 18    Temp:   97.7 F (36.5 C)   TempSrc:   Oral   SpO2: 98% 92% 100% 99%    GEN- The patient is well appearing, alert and oriented x 3 today.   HEENT: normocephalic, atraumatic; sclera clear, conjunctiva pink; hearing intact; oropharynx clear; neck supple Lungs- Clear to ausculation bilaterally, normal work of breathing.  No wheezes, rales, rhonchi Heart- Regular rate and rhythm  GI- soft, non-tender, non-distended, bowel sounds present Extremities- no clubbing, cyanosis, or edema  MS- no significant deformity or atrophy Skin- warm and dry, no rash or lesion Psych- euthymic mood, full affect Neuro- strength and sensation are intact  Labs:   Lab Results  Component Value Date   WBC 4.7 09/03/2017   HGB 12.4 (L) 09/03/2017   HCT 37.4 (L) 09/03/2017   MCV 94.7 09/03/2017   PLT 154 09/03/2017    Recent Labs  Lab  09/03/17 0932  NA 139  K 4.2  CL 107  CO2 25  BUN 15  CREATININE 1.10  CALCIUM 9.2  PROT 6.2*  BILITOT 0.8  ALKPHOS 27*  ALT 35  AST 31  GLUCOSE 105*      Radiology/Studies: Dg Chest 2 View  Result Date: 09/03/2017 CLINICAL DATA:  dizziness EXAM: CHEST - 2 VIEW COMPARISON:  None in PACs FINDINGS: The lungs are mildly hyperinflated and clear. The heart and pulmonary vascularity are normal. The mediastinum is normal in width. The trachea is midline. There is calcification in the wall of the aortic arch. There is multilevel degenerative disc disease of the thoracic spine. IMPRESSION: There is no acute cardiopulmonary abnormality. Mild hyperinflation may be voluntary or may reflect underlying chronic bronchitic changes. Electronically Signed   By: David  Martinique M.D.   On: 09/03/2017 10:02   Ct Head Wo Contrast  Result Date: 09/03/2017 CLINICAL DATA:  Dizziness and nausea EXAM: CT HEAD WITHOUT CONTRAST TECHNIQUE: Contiguous axial images were obtained from the base of the skull through the vertex without intravenous contrast. COMPARISON:  None. FINDINGS: Brain: The ventricles are normal in size and configuration. There is no intracranial mass, hemorrhage, extra-axial fluid collection, or midline shift. There is slight small vessel disease in the centra semiovale bilaterally. Elsewhere gray-white compartments appear normal. There is no evident acute infarct. Vascular: There is no appreciable hyperdense vessel. There is calcification in each carotid siphon region as well as in the distal left vertebral artery. Skull: Bony calvarium appears intact. Sinuses/Orbits: There is mucosal thickening in several ethmoid air cells bilaterally. Other visualized paranasal sinuses are clear. Orbits appear symmetric bilaterally. Other: Mastoid air cells are clear. IMPRESSION: Rather minimal periventricular small vessel disease. No acute infarct. No mass or hemorrhage. There are foci of arterial vascular  calcification. There is mucosal thickening in several ethmoid air cells. Electronically Signed   By: Lowella Grip III M.D.   On: 09/03/2017 10:13    PFX:TKWIO bradycardia, rate 45, question lead placement  (personally reviewed)  TELEMETRY: sinus bradycardia, 40-50's (personally reviewed)  Assessment/Plan: 1.  Sinus bradycardia He is on no rate lowering medications Zaryan Yakubov obtain echo He has a relatively low resting heart rate. He is able to get his HR to 150's at the gym with interval training Rosalba Totty ambulate this afternoon and see how he does  2.  Dizziness His heart rate on EKG from PCP's office was 53 MRI unremarkable Unclear cause  3.  HTN Terrionna Bridwell follow    Signed, Chanetta Marshall, NP 09/03/2017 1:19 PM   I have seen and examined this patient with Chanetta Marshall.  Agree with above, note added to reflect my findings.  On exam, RRR, no murmurs, lungs clear.  Patient an episode of weakness, dizziness, and fatigue.  He does have sinus bradycardia, but is quite fit and gets quite a bit of daily exercise.  At this point, it is unclear as to the cause of his episode.  I am unsure if this is due to sinus bradycardia or some other cause.  It is also unclear to me if he had some sort of bradycardia arrhythmia.  At this point, agree with getting an echo to see what his heart function is, as well as keeping him on telemetry overnight.  She do not have any significant bradycardia overnight, would be okay to discharge with clinic follow-up.  Nadim Malia M. Jahaad Penado MD 09/03/2017 3:56 PM

## 2017-09-03 NOTE — ED Notes (Signed)
ED Provider at bedside. 

## 2017-09-03 NOTE — Progress Notes (Signed)
  Echocardiogram 2D Echocardiogram has been performed.  Darlina Sicilian M 09/03/2017, 3:00 PM

## 2017-09-03 NOTE — CV Procedure (Signed)
Echocardiogram attempted twice.First attempt Stephen Roy was in MRI.The second attempt the nurse practitioner was speaking with him. Echo will be tried again tomorrow.   Darlina Sicilian RDCS

## 2017-09-03 NOTE — ED Triage Notes (Addendum)
Pt woke up this morning- felt dizzy, nauseated. States everything spinning. Physical done Monday was clean. Stable HTN. 4 zofran given by EMS clean. Patient took 5 baby aspirin himself. CBG [facility] per EMS

## 2017-09-03 NOTE — ED Notes (Signed)
Admitting Provider at bedside for evaluation

## 2017-09-03 NOTE — ED Notes (Signed)
2 gold tops drawn and sent to lab

## 2017-09-03 NOTE — H&P (Addendum)
History and Physical    Stephen Roy PHX:[id number] DOB: 05-Jun-1945 DOA: 09/03/2017  PCP: Velna Hatchet, MD Patient coming from: home  Chief Complaint: dizziness  HPI: Stephen Roy is a 72 y.o. male with medical history significant htn and hyperlipidemia presents to the emergency Department chief complaint of persistent dizziness. Initial evaluation reveals bradycardia. [facility] are asked to admit  Information is obtained from the patient. He was in his usual state of health lying in bed reading the newspaper when he experienced sudden onset of dizziness. He got up and stated that he had trouble focusing his eyes. Associated symptoms include nausea and light diaphoresis. No emesis. He took a shower thinking this would make him feel better and it did not. He denies a sensation of the room spinning. He denies any headache or numbness or tingling of extremities. He denies any difficulty chewing or swallowing. He states the symptoms persisted for over an hour and he asked his wife to call EMS. Reportedly he received 4 mg of Zofran from EMS which improved the symptoms.he denies chest pain palpitation shortness of breath. He denies any fever chills cough lower extremity edema. He denies abdominal pain dysuria hematuria frequency or urgency. He denies diarrhea constipation melena bright red blood per rectum.  Of note during interview patient had difficulty remembering where he lived prior to moving to [place] 5 years ago. Wife also stated that his speech seemed slower and more deliberate. Patient admitted to having difficulty recalling his prior hometown .  ED Course: in the emergency department he's afebrile with a blood pressure hand of normal. Heart rate range 43-60.is not hypoxic. He is nontoxic appearing  Review of Systems: As per HPI otherwise all other systems reviewed and are negative.   Ambulatory Status:ambulates independently is independent with ADLs  Past Medical  History:  Diagnosis Date  . Abnormal EKG   . Bradycardia   . Dizziness   . Hyperlipidemia   . Hypertension     Past Surgical History:  Procedure Laterality Date  . CLOSED REDUCTION HAND FRACTURE Right 1961  . FRACTURE SURGERY Left 2001   shoulder  . TONSILLECTOMY  10/1949    Social History   Socioeconomic History  . Marital status: Married    Spouse name: Not on file  . Number of children: Not on file  . Years of education: Not on file  . Highest education level: Not on file  Occupational History  . Not on file  Social Needs  . Financial resource strain: Not on file  . Food insecurity:    Worry: Not on file    Inability: Not on file  . Transportation needs:    Medical: Not on file    Non-medical: Not on file  Tobacco Use  . Smoking status: Never Smoker  . Smokeless tobacco: Never Used  Substance and Sexual Activity  . Alcohol use: Yes    Alcohol/week: 3.0 oz    Types: 5 Standard drinks or equivalent per week  . Drug use: No  . Sexual activity: Not on file  Lifestyle  . Physical activity:    Days per week: Not on file    Minutes per session: Not on file  . Stress: Not on file  Relationships  . Social connections:    Talks on phone: Not on file    Gets together: Not on file    Attends religious service: Not on file    Active member of club or organization: Not on file  Attends meetings of clubs or organizations: Not on file    Relationship status: Not on file  . Intimate partner violence:    Fear of current or ex partner: Not on file    Emotionally abused: Not on file    Physically abused: Not on file    Forced sexual activity: Not on file  Other Topics Concern  . Not on file  Social History Narrative  . Not on file    No Known Allergies  Family History  Problem Relation Age of Onset  . Heart disease Father   . Colon cancer Neg Hx     Prior to Admission medications   Medication Sig Start Date End Date Taking? Authorizing Provider  aspirin  81 MG chewable tablet Chew 81 mg by mouth daily.    Yes [provider]  atorvastatin (LIPITOR) 10 MG tablet Take 10 mg by mouth daily.   Yes [provider]  glucosamine-chondroitin 500-400 MG tablet Take 1 tablet by mouth daily.   Yes [provider]  irbesartan (AVAPRO) 150 MG tablet Take 150 mg by mouth every morning.    Yes [provider]  Multiple Vitamin (MULTIVITAMIN) tablet Take 1 tablet by mouth daily.   Yes [provider]  Omega-3 Fatty Acids (FISH OIL PO) Take 1 capsule by mouth daily.    Yes [provider]    Physical Exam: Vitals:   09/03/17 1045 09/03/17 1100 09/03/17 1115 09/03/17 1130  BP: (!) 160/72 (!) 150/126 138/83 (!) 160/88  Pulse: (!) 47 63 (!) 48 76  Resp: (!) 9 14 16 17   Temp:      TempSrc:      SpO2: 99% 99% 98% 92%     General:  Appears calm and comfortable in no acute distress Eyes:  PERRL, EOMI, normal lids, iris ENT:  grossly normal hearing, lips & tongue, mucous membranes of his mouth are pink but dry Neck:  no LAD, masses or thyromegaly Cardiovascular:  Bradycardic but regular no murmur gallop or rub no lower extremity edema Respiratory:  CTA bilaterally, no w/r/r. Normal respiratory effort. Abdomen:  Soft,positive bowel sounds throughout no guarding or rebounding Skin:  no rash or induration seen on limited exam Musculoskeletal:  grossly normal tone BUE/BLE, good ROM, no bony abnormality Psychiatric:  grossly normal mood and affect, speech fluent and appropriate, AOx3 Neurologic:  Alert and oriented 3 speech clear but slow tongue midline no pronator drift bilateral grip 5 out of 5 lower extremity strength 5 out of 5  Labs on Admission: I have personally reviewed following labs and imaging studies  CBC: Recent Labs  Lab 09/03/17 0932  WBC 4.7  NEUTROABS 3.0  HGB 12.4*  HCT 37.4*  MCV 94.7  PLT 646   Basic Metabolic Panel: Recent Labs  Lab 09/03/17 0932  NA 139  K 4.2  CL 107    CO2 25  GLUCOSE 105*  BUN 15  CREATININE 1.10  CALCIUM 9.2   GFR: CrCl cannot be calculated (Unknown ideal weight.). Liver Function Tests: Recent Labs  Lab 09/03/17 0932  AST 31  ALT 35  ALKPHOS 27*  BILITOT 0.8  PROT 6.2*  ALBUMIN 3.7   No results for input(s): LIPASE, AMYLASE in the last 168 hours. No results for input(s): AMMONIA in the last 168 hours. Coagulation Profile: No results for input(s): INR, PROTIME in the last 168 hours. Cardiac Enzymes: No results for input(s): CKTOTAL, CKMB, CKMBINDEX, TROPONINI in the last 168 hours. BNP (last 3 results)  No results for input(s): PROBNP in the last 8760 hours. HbA1C: No results for input(s): HGBA1C in the last 72 hours. CBG: Recent Labs  Lab 09/03/17 1012  GLUCAP 101*   Lipid Profile: No results for input(s): CHOL, HDL, LDLCALC, TRIG, CHOLHDL, LDLDIRECT in the last 72 hours. Thyroid Function Tests: No results for input(s): TSH, T4TOTAL, FREET4, T3FREE, THYROIDAB in the last 72 hours. Anemia Panel: No results for input(s): VITAMINB12, FOLATE, FERRITIN, TIBC, IRON, RETICCTPCT in the last 72 hours. Urine analysis: No results found for: COLORURINE, APPEARANCEUR, LABSPEC, PHURINE, GLUCOSEU, HGBUR, BILIRUBINUR, KETONESUR, PROTEINUR, UROBILINOGEN, NITRITE, LEUKOCYTESUR  Creatinine Clearance: CrCl cannot be calculated (Unknown ideal weight.).  Sepsis Labs: @LABRCNTIP (procalcitonin:4,lacticidven:4) )No results found for this or any previous visit (from the past 240 hour(s)).   Radiological Exams on Admission: Dg Chest 2 View  Result Date: 09/03/2017 CLINICAL DATA:  dizziness EXAM: CHEST - 2 VIEW COMPARISON:  None in PACs FINDINGS: The lungs are mildly hyperinflated and clear. The heart and pulmonary vascularity are normal. The mediastinum is normal in width. The trachea is midline. There is calcification in the wall of the aortic arch. There is multilevel degenerative disc disease of the thoracic spine. IMPRESSION:  There is no acute cardiopulmonary abnormality. Mild hyperinflation may be voluntary or may reflect underlying chronic bronchitic changes. Electronically Signed   By: David  Martinique M.D.   On: 09/03/2017 10:02   Ct Head Wo Contrast  Result Date: 09/03/2017 CLINICAL DATA:  Dizziness and nausea EXAM: CT HEAD WITHOUT CONTRAST TECHNIQUE: Contiguous axial images were obtained from the base of the skull through the vertex without intravenous contrast. COMPARISON:  None. FINDINGS: Brain: The ventricles are normal in size and configuration. There is no intracranial mass, hemorrhage, extra-axial fluid collection, or midline shift. There is slight small vessel disease in the centra semiovale bilaterally. Elsewhere gray-white compartments appear normal. There is no evident acute infarct. Vascular: There is no appreciable hyperdense vessel. There is calcification in each carotid siphon region as well as in the distal left vertebral artery. Skull: Bony calvarium appears intact. Sinuses/Orbits: There is mucosal thickening in several ethmoid air cells bilaterally. Other visualized paranasal sinuses are clear. Orbits appear symmetric bilaterally. Other: Mastoid air cells are clear. IMPRESSION: Rather minimal periventricular small vessel disease. No acute infarct. No mass or hemorrhage. There are foci of arterial vascular calcification. There is mucosal thickening in several ethmoid air cells. Electronically Signed   By: Lowella Grip III M.D.   On: 09/03/2017 10:13    EKG: Independently reviewed. Sinus or ectopic atrial bradycardia RVH with secondary repolarization abnrm Anterolateral infarct, age indeterminate Baseline wander in lead(s) V3  Assessment/Plan Principal Problem:   Dizziness Active Problems:   Bradycardia   Abnormal EKG   Hypertension   Hyperlipidemia   #1. Dizziness. Etiology uncertain. EKG does show some bradycardia. Presentation also concerning for possible neurological event. CT of the head  without acute abnormality. No signs of infection although urinalysis is pending. No metabolic derangement. Improved with Zofran. Resolved at time of admission -Admit to telemetry - Gentle IV fluids -Obtain orthostatic vital signs -MRI MRA of the brain -supportive therapy  2. Bradycardia. EKG as noted above. Heart rate range 43-60. No rate control med on home list -Obtain a TSH -request cardiology consult -monitor  3. Hypertension. Blood pressure high end of normal in the emergency department. Home medications include avapro -continue avapro -Monitor   #4. Abnormal EKG.EKG Sinus or ectopic atrial bradycardia RVH with secondary repolarization abnrm Anterolateral infarct, age indeterminate Baseline wander  in lead(s) V3 no prior available for comparison. Patient reports he had an EKG earlier this week his primary care provider's office -Request EKG from [place] -See above -Await cardiology recommendations  #5. Hyperlipidemia -Continue statin    DVT prophylaxis: lovenox  Code Status: full  Family Communication: wife and daughter  Disposition Plan: home 24 hours  Consults called: cardmaster  Admission status: obs    Dyanne Carrel M MD [facility]  If 7PM-7AM, please contact night-coverage [email or web address] Password [facility]  09/03/2017, 11:46 AM

## 2017-09-03 NOTE — ED Notes (Signed)
Report given to Nora.

## 2017-09-03 NOTE — ED Notes (Signed)
Patient transported to X-ray 

## 2017-09-04 DIAGNOSIS — Z7982 Long term (current) use of aspirin: Secondary | ICD-10-CM | POA: Diagnosis not present

## 2017-09-04 DIAGNOSIS — I10 Essential (primary) hypertension: Secondary | ICD-10-CM | POA: Diagnosis not present

## 2017-09-04 DIAGNOSIS — Z79899 Other long term (current) drug therapy: Secondary | ICD-10-CM | POA: Diagnosis not present

## 2017-09-04 DIAGNOSIS — R42 Dizziness and giddiness: Secondary | ICD-10-CM

## 2017-09-04 DIAGNOSIS — I062 Rheumatic aortic stenosis with insufficiency: Secondary | ICD-10-CM | POA: Diagnosis not present

## 2017-09-04 DIAGNOSIS — I6782 Cerebral ischemia: Secondary | ICD-10-CM | POA: Diagnosis not present

## 2017-09-04 DIAGNOSIS — I420 Dilated cardiomyopathy: Secondary | ICD-10-CM | POA: Diagnosis not present

## 2017-09-04 DIAGNOSIS — I495 Sick sinus syndrome: Secondary | ICD-10-CM | POA: Diagnosis not present

## 2017-09-04 DIAGNOSIS — E785 Hyperlipidemia, unspecified: Secondary | ICD-10-CM | POA: Diagnosis not present

## 2017-09-04 DIAGNOSIS — R001 Bradycardia, unspecified: Secondary | ICD-10-CM | POA: Diagnosis not present

## 2017-09-04 DIAGNOSIS — Z86010 Personal history of colonic polyps: Secondary | ICD-10-CM | POA: Diagnosis not present

## 2017-09-04 DIAGNOSIS — R9431 Abnormal electrocardiogram [ECG] [EKG]: Secondary | ICD-10-CM | POA: Diagnosis not present

## 2017-09-04 DIAGNOSIS — Z8249 Family history of ischemic heart disease and other diseases of the circulatory system: Secondary | ICD-10-CM | POA: Diagnosis not present

## 2017-09-04 NOTE — Progress Notes (Signed)
Pt has orders to be discharged. Discharge instructions given and pt has no additional questions at this time. Medication regimen reviewed and pt educated. Pt verbalized understanding and has no additional questions. Telemetry box removed. IV removed and site in good condition. Pt stable and waiting for transportation. 

## 2017-09-04 NOTE — Discharge Summary (Signed)
Physician Discharge Summary  Patient ID: Stephen Roy MRN: [id number] DOB/AGE: 1945-09-07 72 y.o.  Admit date: 09/03/2017 Discharge date: 09/04/2017  Admission Diagnoses:  Discharge Diagnoses:  Principal Problem:   Dizziness Active Problems:   Hypertension   Hyperlipidemia   Bradycardia   Abnormal EKG   Discharged Condition: stable  Hospital Course: Patient is a 72 year old Caucasian male with past medical history significant for hypertension and hyperlipidemia.  Patient reports being fairly active, skis and goes to the gym a lot.  Patient was admitted with dizziness.  Work-up done has revealed bradycardia, with heart rate in the 40s.  MRI and MRA brain were none revealing.  Echocardiogram revealed normal EF and diastolic function, but revealed mild aortic stenosis and regurgitation.  Troponin was 0.02.  Dizziness lasted for about 4 hours, and has resolved since then.  Cardiology team has cleared patient for discharge.  Patient is also eager to be discharged back home.  Patient will follow with primary care provider and the cardiology team on discharge.  Consults: cardiology  Significant Diagnostic Studies: See above.  Discharge Exam: Blood pressure 112/72, pulse (!) 42, temperature 97.7 F (36.5 C), temperature source Oral, resp. rate 16, height 6\' 3"  (1.905 m), weight 90.8 kg (200 lb 1.6 oz), SpO2 95 %.   Disposition: Discharge disposition: 01-Home or Self Care   Discharge Instructions    Call MD for:   Complete by:  As directed    Please call MD if the symptoms worsen   Diet - low sodium heart healthy   Complete by:  As directed    Increase activity slowly   Complete by:  As directed      Allergies as of 09/04/2017   No Known Allergies     Medication List    STOP taking these medications   glucosamine-chondroitin 500-400 MG tablet     TAKE these medications   aspirin 81 MG chewable tablet Chew 81 mg by mouth daily.   atorvastatin 10 MG tablet Commonly  known as:  LIPITOR Take 10 mg by mouth daily.   FISH OIL PO Take 1 capsule by mouth daily.   irbesartan 150 MG tablet Commonly known as:  AVAPRO Take 150 mg by mouth every morning.   multivitamin tablet Take 1 tablet by mouth daily.      Follow-up Information    Constance Haw, MD Follow up on 10/19/2017.   Specialty:  Cardiology Why:  at [facility] information: [place] [address] [phone]           Time spent discharging patient -25 minutes.  SignedBonnell Public 09/04/2017, 10:02 AM

## 2017-09-04 NOTE — Progress Notes (Addendum)
Electrophysiology Rounding Note  Patient Name: Stephen Roy Date of Encounter: 09/04/2017  Electrophysiologist: Curt Bears   Subjective   The patient is doing well today.  At this time, the patient denies chest pain, shortness of breath, or any new concerns.  Inpatient Medications    Scheduled Meds: . aspirin  81 mg Oral Daily  . atorvastatin  10 mg Oral Daily  . enoxaparin (LOVENOX) injection  40 mg Subcutaneous Q24H  . irbesartan  150 mg Oral q morning - 10a   Continuous Infusions: . sodium chloride 50 mL/hr at 09/03/17 1528   PRN Meds: acetaminophen **OR** acetaminophen, bisacodyl, HYDROcodone-acetaminophen, ondansetron **OR** ondansetron (ZOFRAN) IV, traZODone   Vital Signs    Vitals:   09/03/17 2203 09/04/17 0008 09/04/17 0436 09/04/17 0500  BP:  130/70 112/72   Pulse:  61 (!) 42   Resp:  18 16   Temp:  97.6 F (36.4 C) 97.7 F (36.5 C)   TempSrc:  Oral Oral   SpO2:  97% 95%   Weight: 203 lb 6.4 oz (92.3 kg)   200 lb 1.6 oz (90.8 kg)  Height: 6\' 3"  (1.905 m)       Intake/Output Summary (Last 24 hours) at 09/04/2017 0704 Last data filed at 09/04/2017 0644 Gross per 24 hour  Intake 1242.07 ml  Output 750 ml  Net 492.07 ml   Filed Weights   09/03/17 2203 09/04/17 0500  Weight: 203 lb 6.4 oz (92.3 kg) 200 lb 1.6 oz (90.8 kg)    Physical Exam    GEN- The patient is well appearing, alert and oriented x 3 today.   Head- normocephalic, atraumatic Eyes-  Sclera clear, conjunctiva pink Ears- hearing intact Oropharynx- clear Neck- supple Lungs- Clear to ausculation bilaterally, normal work of breathing Heart- Bradycardic regular rate and rhythm  GI- soft, NT, ND, + BS Extremities- no clubbing, cyanosis, or edema Skin- no rash or lesion Psych- euthymic mood, full affect Neuro- strength and sensation are intact  Labs    CBC Recent Labs    09/03/17 0932  WBC 4.7  NEUTROABS 3.0  HGB 12.4*  HCT 37.4*  MCV 94.7  PLT 315   Basic Metabolic  Panel Recent Labs    09/03/17 0932  NA 139  K 4.2  CL 107  CO2 25  GLUCOSE 105*  BUN 15  CREATININE 1.10  CALCIUM 9.2   Liver Function Tests Recent Labs    09/03/17 0932  AST 31  ALT 35  ALKPHOS 27*  BILITOT 0.8  PROT 6.2*  ALBUMIN 3.7   Thyroid Function Tests Recent Labs    09/03/17 1131  TSH 0.520    Telemetry    Sinus brady (personally reviewed)  Radiology    Dg Chest 2 View  Result Date: 09/03/2017 CLINICAL DATA:  dizziness EXAM: CHEST - 2 VIEW COMPARISON:  None in PACs FINDINGS: The lungs are mildly hyperinflated and clear. The heart and pulmonary vascularity are normal. The mediastinum is normal in width. The trachea is midline. There is calcification in the wall of the aortic arch. There is multilevel degenerative disc disease of the thoracic spine. IMPRESSION: There is no acute cardiopulmonary abnormality. Mild hyperinflation may be voluntary or may reflect underlying chronic bronchitic changes. Electronically Signed   By: David  Martinique M.D.   On: 09/03/2017 10:02   Ct Head Wo Contrast  Result Date: 09/03/2017 CLINICAL DATA:  Dizziness and nausea EXAM: CT HEAD WITHOUT CONTRAST TECHNIQUE: Contiguous axial images were obtained from the base of  the skull through the vertex without intravenous contrast. COMPARISON:  None. FINDINGS: Brain: The ventricles are normal in size and configuration. There is no intracranial mass, hemorrhage, extra-axial fluid collection, or midline shift. There is slight small vessel disease in the centra semiovale bilaterally. Elsewhere gray-white compartments appear normal. There is no evident acute infarct. Vascular: There is no appreciable hyperdense vessel. There is calcification in each carotid siphon region as well as in the distal left vertebral artery. Skull: Bony calvarium appears intact. Sinuses/Orbits: There is mucosal thickening in several ethmoid air cells bilaterally. Other visualized paranasal sinuses are clear. Orbits appear  symmetric bilaterally. Other: Mastoid air cells are clear. IMPRESSION: Rather minimal periventricular small vessel disease. No acute infarct. No mass or hemorrhage. There are foci of arterial vascular calcification. There is mucosal thickening in several ethmoid air cells. Electronically Signed   By: Lowella Grip III M.D.   On: 09/03/2017 10:13   Mr Jodene Nam Head Wo Contrast  Result Date: 09/03/2017 CLINICAL DATA:  72 year old male with sudden onset dizziness and nausea this morning. EXAM: MRI HEAD WITHOUT CONTRAST MRA HEAD WITHOUT CONTRAST TECHNIQUE: Multiplanar, multiecho pulse sequences of the brain and surrounding structures were obtained without intravenous contrast. Angiographic images of the head were obtained using MRA technique without contrast. COMPARISON:  Head CT without contrast 1005 hours today. FINDINGS: MRI HEAD FINDINGS Brain: No restricted diffusion to suggest acute infarction. No midline shift, mass effect, evidence of mass lesion, ventriculomegaly, extra-axial collection or acute intracranial hemorrhage. Cervicomedullary junction and pituitary are within normal limits. Pearline Cables and white matter signal is largely normal for age throughout the brain. There is minimal to mild nonspecific subcortical white matter T2 and FLAIR hyperintensity in both hemispheres, mostly along the operculum a no encephalomalacia or chronic cerebral blood products identified. Vascular: Major intracranial vascular flow voids are preserved, the distal left vertebral artery appears dominant and there is generalized intracranial artery tortuosity. A non dominant right vertebral artery probably terminates in PICA. Skull and upper cervical spine: Negative visible cervical spine. Visualized bone marrow signal is within normal limits. Incidental small arachnoid granulation along the posterior right foramen magnum (series 8, image 4). Sinuses/Orbits: Normal orbits soft tissues. Paranasal Visualized paranasal sinuses and mastoids  are stable and well pneumatized. Other: Visible internal auditory structures appear normal. Scalp and face soft tissues appear negative. MRA HEAD FINDINGS Antegrade flow in the posterior circulation with a dominant left vertebral artery that supplies the basilar. Normal left PICA origin. A diminutive distal right vertebral artery terminates in PICA. Tortuous basilar artery without stenosis. Normal SCA origins. Tortuous but otherwise normal bilateral PCA origins. Posterior communicating arteries are diminutive or absent. Bilateral PCA branches are within normal limits (mild MO TSA artifact suspected in the right P2 segment. Antegrade flow in both ICA siphons. Siphon irregularity and evidence of calcified atherosclerosis without ICA siphon stenosis. Normal ophthalmic artery origins. Patent carotid termini. Normal MCA and ACA origins. Anterior communicating artery and visible ACA branches are within normal limits. The MCA M1 segments are mildly tortuous. Both MCA bifurcations are patent without stenosis. The visible bilateral MCA branches are within normal limits. IMPRESSION: 1. No acute intracranial abnormality. 2. Essentially normal for age noncontrast MRI appearance of the brain. 3. Negative MRA; intracranial artery tortuosity and large vessel calcified plaque without stenosis. Electronically Signed   By: Genevie Ann M.D.   On: 09/03/2017 13:35   Mr Brain Wo Contrast  Result Date: 09/03/2017 CLINICAL DATA:  72 year old male with sudden onset dizziness and nausea this morning.  EXAM: MRI HEAD WITHOUT CONTRAST MRA HEAD WITHOUT CONTRAST TECHNIQUE: Multiplanar, multiecho pulse sequences of the brain and surrounding structures were obtained without intravenous contrast. Angiographic images of the head were obtained using MRA technique without contrast. COMPARISON:  Head CT without contrast 1005 hours today. FINDINGS: MRI HEAD FINDINGS Brain: No restricted diffusion to suggest acute infarction. No midline shift, mass  effect, evidence of mass lesion, ventriculomegaly, extra-axial collection or acute intracranial hemorrhage. Cervicomedullary junction and pituitary are within normal limits. Pearline Cables and white matter signal is largely normal for age throughout the brain. There is minimal to mild nonspecific subcortical white matter T2 and FLAIR hyperintensity in both hemispheres, mostly along the operculum a no encephalomalacia or chronic cerebral blood products identified. Vascular: Major intracranial vascular flow voids are preserved, the distal left vertebral artery appears dominant and there is generalized intracranial artery tortuosity. A non dominant right vertebral artery probably terminates in PICA. Skull and upper cervical spine: Negative visible cervical spine. Visualized bone marrow signal is within normal limits. Incidental small arachnoid granulation along the posterior right foramen magnum (series 8, image 4). Sinuses/Orbits: Normal orbits soft tissues. Paranasal Visualized paranasal sinuses and mastoids are stable and well pneumatized. Other: Visible internal auditory structures appear normal. Scalp and face soft tissues appear negative. MRA HEAD FINDINGS Antegrade flow in the posterior circulation with a dominant left vertebral artery that supplies the basilar. Normal left PICA origin. A diminutive distal right vertebral artery terminates in PICA. Tortuous basilar artery without stenosis. Normal SCA origins. Tortuous but otherwise normal bilateral PCA origins. Posterior communicating arteries are diminutive or absent. Bilateral PCA branches are within normal limits (mild MO TSA artifact suspected in the right P2 segment. Antegrade flow in both ICA siphons. Siphon irregularity and evidence of calcified atherosclerosis without ICA siphon stenosis. Normal ophthalmic artery origins. Patent carotid termini. Normal MCA and ACA origins. Anterior communicating artery and visible ACA branches are within normal limits. The MCA M1  segments are mildly tortuous. Both MCA bifurcations are patent without stenosis. The visible bilateral MCA branches are within normal limits. IMPRESSION: 1. No acute intracranial abnormality. 2. Essentially normal for age noncontrast MRI appearance of the brain. 3. Negative MRA; intracranial artery tortuosity and large vessel calcified plaque without stenosis. Electronically Signed   By: Genevie Ann M.D.   On: 09/03/2017 13:35     Assessment & Plan    1. Sinus bradycardia Asymptomatic Pt ambulated yesterday without difficulty Echo with mild LVH, mild AS No indication for PPM implant Avoid rate slowing medications  2.  Weakness Unclear cause Not clearly related to bradycardia as no symptoms this morning with similar heart rate MRI unremarkable  Ok from our standpoint to discharge home. Outpatient follow up arranged Electrophysiology team to see as needed while here. Please call with questions.  Signed, Chanetta Marshall, NP  09/04/2017, 7:04 AM   I have seen and examined this patient with Chanetta Marshall.  Agree with above, note added to reflect my findings.  On exam, RRR, no murmurs, lungs clear. No further episodes overnight. Telemetry and TTE unremarkable. OK to discharge with EP follow up.    Sundi Slevin M. Zaniah Titterington MD 09/04/2017 11:09 AM

## 2017-09-11 DIAGNOSIS — R42 Dizziness and giddiness: Secondary | ICD-10-CM | POA: Diagnosis not present

## 2017-09-11 DIAGNOSIS — Z6827 Body mass index (BMI) 27.0-27.9, adult: Secondary | ICD-10-CM | POA: Diagnosis not present

## 2017-09-11 DIAGNOSIS — I10 Essential (primary) hypertension: Secondary | ICD-10-CM | POA: Diagnosis not present

## 2017-09-11 DIAGNOSIS — I350 Nonrheumatic aortic (valve) stenosis: Secondary | ICD-10-CM | POA: Diagnosis not present

## 2017-09-11 DIAGNOSIS — R001 Bradycardia, unspecified: Secondary | ICD-10-CM | POA: Diagnosis not present

## 2017-09-14 DIAGNOSIS — H524 Presbyopia: Secondary | ICD-10-CM | POA: Diagnosis not present

## 2017-09-14 DIAGNOSIS — H2513 Age-related nuclear cataract, bilateral: Secondary | ICD-10-CM | POA: Diagnosis not present

## 2017-10-19 ENCOUNTER — Ambulatory Visit (INDEPENDENT_AMBULATORY_CARE_PROVIDER_SITE_OTHER): Payer: Medicare Other | Admitting: Cardiology

## 2017-10-19 ENCOUNTER — Encounter: Payer: Self-pay | Admitting: Cardiology

## 2017-10-19 VITALS — BP 130/66 | HR 63 | Ht 75.0 in | Wt 206.0 lb

## 2017-10-19 DIAGNOSIS — R001 Bradycardia, unspecified: Secondary | ICD-10-CM

## 2017-10-19 DIAGNOSIS — R531 Weakness: Secondary | ICD-10-CM | POA: Diagnosis not present

## 2017-10-19 NOTE — Progress Notes (Signed)
Electrophysiology Office Note   Date:  10/19/2017   ID:  Stephen Roy, DOB Nov 14, 1945, MRN [id number]  PCP:  Stephen Hatchet, MD  Cardiologist:   Primary Electrophysiologist:  Stephen Demarcus Meredith Leeds, MD    No chief complaint on file.    History of Present Illness: Stephen Roy is a 72 y.o. male who is being seen today for the evaluation of heart block at the request of Stephen Hatchet, MD. Presenting today for electrophysiology evaluation.  He has a history of hypertension and hyperlipidemia.  July 2019, he had an episode of weakness and fatigue.  This is associated with near syncope.  EMS was called and he was noted to be bradycardic with heart rates in the 40s.  EKG at the time showed sinus bradycardia.  He was ambulated and felt well.  He reported an ability to get his heart rate into the 150s at the gym.   Today, he denies symptoms of palpitations, chest pain, shortness of breath, orthopnea, PND, lower extremity edema, claudication, dizziness, presyncope, syncope, bleeding, or neurologic sequela. The patient is tolerating medications without difficulties.    Past Medical History:  Diagnosis Date  . Abnormal EKG   . Bradycardia   . Dizziness   . Hyperlipidemia   . Hypertension    Past Surgical History:  Procedure Laterality Date  . CLOSED REDUCTION HAND FRACTURE Right 1961  . FRACTURE SURGERY Left 2001   shoulder  . TONSILLECTOMY  10/1949     Current Outpatient Medications  Medication Sig Dispense Refill  . aspirin 81 MG chewable tablet Chew 81 mg by mouth daily.     Marland Kitchen atorvastatin (LIPITOR) 10 MG tablet Take 10 mg by mouth daily.    . irbesartan (AVAPRO) 150 MG tablet Take 150 mg by mouth every morning.     . Multiple Vitamin (MULTIVITAMIN) tablet Take 1 tablet by mouth daily.    . Omega-3 Fatty Acids (FISH OIL PO) Take 1 capsule by mouth daily.      No current facility-administered medications for this visit.     Allergies:   Patient has no known allergies.    Social History:  The patient  reports that he has never smoked. He has never used smokeless tobacco. He reports that he drinks about 5.0 standard drinks of alcohol per week. He reports that he does not use drugs.   Family History:  The patient's family history includes Heart disease in his father.    ROS:  Please see the history of present illness.   Otherwise, review of systems is positive for none.   All other systems are reviewed and negative.    PHYSICAL EXAM: VS:  BP 130/66   Pulse 63   Ht 6\' 3"  (1.905 m)   Wt 206 lb (93.4 kg)   SpO2 97%   BMI 25.75 kg/m  , BMI Body mass index is 25.75 kg/m. GEN: Well nourished, well developed, in no acute distress  HEENT: normal  Neck: no JVD, carotid bruits, or masses Cardiac: RRR; no murmurs, rubs, or gallops,no edema  Respiratory:  clear to auscultation bilaterally, normal work of breathing GI: soft, nontender, nondistended, + BS MS: no deformity or atrophy  Skin: warm and dry Neuro:  Strength and sensation are intact Psych: euthymic mood, full affect  EKG:  EKG is not ordered today. Personal review of the ekg ordered 09/05/17 shows sinus rhythm, incomplete right bundle, rate 45   Recent Labs: 09/03/2017: ALT 35; BUN 15; Creatinine, Ser 1.10; Hemoglobin  12.4; Platelets 154; Potassium 4.2; Sodium 139; TSH 0.520    Lipid Panel  No results found for: CHOL, TRIG, HDL, CHOLHDL, VLDL, LDLCALC, LDLDIRECT   Wt Readings from Last 3 Encounters:  10/19/17 206 lb (93.4 kg)  09/04/17 200 lb 1.6 oz (90.8 kg)  12/26/15 211 lb (95.7 kg)      Other studies Reviewed: Additional studies/ records that were reviewed today include: TTE 09/03/2017 Review of the above records today demonstrates:  - Left ventricle: The cavity size was normal. Wall thickness was   increased in a pattern of mild LVH. Systolic function was normal.   The estimated ejection fraction was in the range of 60% to 65%.   Wall motion was normal; there were no regional wall  motion   abnormalities. Left ventricular diastolic function parameters   were normal. - Aortic valve: Valve mobility was restricted. 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 There was mild   regurgitation. Valve area (Vmax): 0 cm^2. Valve area (Vmean): 0   cm^2. - Mitral valve: Transvalvular velocity was within the normal range.   There was no evidence for stenosis. There was no regurgitation. - Right ventricle: The cavity size was normal. Wall thickness was   normal. Systolic function was normal. - Right atrium: The atrium was moderately dilated. - Tricuspid valve: There was no regurgitation. - Pulmonary arteries: Systolic pressure was within the normal   range.   ASSESSMENT AND PLAN:  1.  Sinus bradycardia: Has had sinus bradycardia multiple times without major symptoms.  He is quite active and continues to be able to get his heart rate up into the 140s.  No indications for pacemaker at this time.  2.  Weakness: Has had one episode but no further.  Also had an episode 30 years ago with similar symptoms.  Has had no further episodes.  He Aubrianne Molyneux call us back if he has any further issues.    Current medicines are reviewed at length with the patient today.   The patient does not have concerns regarding his medicines.  The following changes were made today:  none  Labs/ tests ordered today include:  No orders of the defined types were placed in this encounter.    Disposition:   FU with Pretty Weltman PRN  Signed, Koryn Charlot Meredith Leeds, MD  10/19/2017 11:22 AM     [facility] [address] [place] [address] [phone] (office) [phone] (fax)

## 2017-12-28 DIAGNOSIS — Z23 Encounter for immunization: Secondary | ICD-10-CM | POA: Diagnosis not present

## 2018-01-04 DIAGNOSIS — H6690 Otitis media, unspecified, unspecified ear: Secondary | ICD-10-CM | POA: Diagnosis not present

## 2018-01-04 DIAGNOSIS — H1030 Unspecified acute conjunctivitis, unspecified eye: Secondary | ICD-10-CM | POA: Diagnosis not present

## 2018-01-13 DIAGNOSIS — L578 Other skin changes due to chronic exposure to nonionizing radiation: Secondary | ICD-10-CM | POA: Diagnosis not present

## 2018-01-13 DIAGNOSIS — D492 Neoplasm of unspecified behavior of bone, soft tissue, and skin: Secondary | ICD-10-CM | POA: Diagnosis not present

## 2018-01-13 DIAGNOSIS — D224 Melanocytic nevi of scalp and neck: Secondary | ICD-10-CM | POA: Diagnosis not present

## 2018-01-13 DIAGNOSIS — L821 Other seborrheic keratosis: Secondary | ICD-10-CM | POA: Diagnosis not present

## 2018-05-21 DIAGNOSIS — M9905 Segmental and somatic dysfunction of pelvic region: Secondary | ICD-10-CM | POA: Diagnosis not present

## 2018-05-21 DIAGNOSIS — M9903 Segmental and somatic dysfunction of lumbar region: Secondary | ICD-10-CM | POA: Diagnosis not present

## 2018-05-21 DIAGNOSIS — M9902 Segmental and somatic dysfunction of thoracic region: Secondary | ICD-10-CM | POA: Diagnosis not present

## 2018-05-21 DIAGNOSIS — M9901 Segmental and somatic dysfunction of cervical region: Secondary | ICD-10-CM | POA: Diagnosis not present

## 2018-05-24 DIAGNOSIS — M9905 Segmental and somatic dysfunction of pelvic region: Secondary | ICD-10-CM | POA: Diagnosis not present

## 2018-05-24 DIAGNOSIS — M9903 Segmental and somatic dysfunction of lumbar region: Secondary | ICD-10-CM | POA: Diagnosis not present

## 2018-05-24 DIAGNOSIS — M9901 Segmental and somatic dysfunction of cervical region: Secondary | ICD-10-CM | POA: Diagnosis not present

## 2018-05-24 DIAGNOSIS — M9902 Segmental and somatic dysfunction of thoracic region: Secondary | ICD-10-CM | POA: Diagnosis not present

## 2018-05-28 DIAGNOSIS — M9902 Segmental and somatic dysfunction of thoracic region: Secondary | ICD-10-CM | POA: Diagnosis not present

## 2018-05-28 DIAGNOSIS — M9905 Segmental and somatic dysfunction of pelvic region: Secondary | ICD-10-CM | POA: Diagnosis not present

## 2018-05-28 DIAGNOSIS — M9903 Segmental and somatic dysfunction of lumbar region: Secondary | ICD-10-CM | POA: Diagnosis not present

## 2018-05-28 DIAGNOSIS — M9901 Segmental and somatic dysfunction of cervical region: Secondary | ICD-10-CM | POA: Diagnosis not present

## 2018-08-30 DIAGNOSIS — I10 Essential (primary) hypertension: Secondary | ICD-10-CM | POA: Diagnosis not present

## 2018-08-30 DIAGNOSIS — Z125 Encounter for screening for malignant neoplasm of prostate: Secondary | ICD-10-CM | POA: Diagnosis not present

## 2018-08-30 DIAGNOSIS — Z20828 Contact with and (suspected) exposure to other viral communicable diseases: Secondary | ICD-10-CM | POA: Diagnosis not present

## 2018-08-30 DIAGNOSIS — R82998 Other abnormal findings in urine: Secondary | ICD-10-CM | POA: Diagnosis not present

## 2018-08-30 DIAGNOSIS — E7849 Other hyperlipidemia: Secondary | ICD-10-CM | POA: Diagnosis not present

## 2018-09-06 DIAGNOSIS — E785 Hyperlipidemia, unspecified: Secondary | ICD-10-CM | POA: Diagnosis not present

## 2018-09-06 DIAGNOSIS — Z0000 Encounter for general adult medical examination without abnormal findings: Secondary | ICD-10-CM | POA: Diagnosis not present

## 2018-09-06 DIAGNOSIS — I10 Essential (primary) hypertension: Secondary | ICD-10-CM | POA: Diagnosis not present

## 2018-09-06 DIAGNOSIS — I350 Nonrheumatic aortic (valve) stenosis: Secondary | ICD-10-CM | POA: Diagnosis not present

## 2018-09-06 DIAGNOSIS — N529 Male erectile dysfunction, unspecified: Secondary | ICD-10-CM | POA: Diagnosis not present

## 2018-09-06 DIAGNOSIS — R001 Bradycardia, unspecified: Secondary | ICD-10-CM | POA: Diagnosis not present

## 2018-09-06 DIAGNOSIS — Z1331 Encounter for screening for depression: Secondary | ICD-10-CM | POA: Diagnosis not present

## 2018-09-10 ENCOUNTER — Encounter: Payer: Self-pay | Admitting: Gastroenterology

## 2018-09-16 ENCOUNTER — Encounter: Payer: Self-pay | Admitting: Gastroenterology

## 2018-09-20 DIAGNOSIS — H2513 Age-related nuclear cataract, bilateral: Secondary | ICD-10-CM | POA: Diagnosis not present

## 2018-09-20 DIAGNOSIS — H52203 Unspecified astigmatism, bilateral: Secondary | ICD-10-CM | POA: Diagnosis not present

## 2018-10-05 DIAGNOSIS — Z23 Encounter for immunization: Secondary | ICD-10-CM | POA: Diagnosis not present

## 2018-10-11 ENCOUNTER — Ambulatory Visit (AMBULATORY_SURGERY_CENTER): Payer: Self-pay | Admitting: *Deleted

## 2018-10-11 ENCOUNTER — Other Ambulatory Visit: Payer: Self-pay

## 2018-10-11 VITALS — Ht 75.0 in | Wt 210.4 lb

## 2018-10-11 DIAGNOSIS — Z86010 Personal history of colonic polyps: Secondary | ICD-10-CM

## 2018-10-11 MED ORDER — GOLYTELY 236 G PO SOLR: 4000.0000 mL | Freq: Once | ORAL | 0 refills | Status: AC

## 2018-10-11 NOTE — Progress Notes (Signed)
Patient denies any allergies to egg or soy products. Patient denies complications with anesthesia/sedation.  Patient denies oxygen use at home and denies diet medications. Emmi instructions for colonoscopy explained and but patient denied.Stephen Roy

## 2018-10-26 ENCOUNTER — Telehealth: Payer: Self-pay

## 2018-10-26 NOTE — Telephone Encounter (Signed)
Covid-19 screening questions   Do you now or have you had a fever in the last 14 days? NO   Do you have any respiratory symptoms of shortness of breath or cough now or in the last 14 days? NO  Do you have any family members or close contacts with diagnosed or suspected Covid-19 in the past 14 days? NO  Have you been tested for Covid-19 and found to be positive? NO        

## 2018-10-27 ENCOUNTER — Encounter: Payer: Self-pay | Admitting: Gastroenterology

## 2018-10-27 ENCOUNTER — Other Ambulatory Visit: Payer: Self-pay

## 2018-10-27 ENCOUNTER — Ambulatory Visit (AMBULATORY_SURGERY_CENTER): Payer: Medicare Other | Admitting: Gastroenterology

## 2018-10-27 VITALS — BP 120/62 | HR 62 | Temp 98.6°F | Resp 16 | Ht 75.0 in | Wt 206.0 lb

## 2018-10-27 DIAGNOSIS — Z86010 Personal history of colonic polyps: Secondary | ICD-10-CM | POA: Diagnosis not present

## 2018-10-27 MED ORDER — SODIUM CHLORIDE 0.9 % IV SOLN: 500.0000 mL | Freq: Once | INTRAVENOUS | Status: DC

## 2018-10-27 NOTE — Progress Notes (Signed)
Pt's states no medical or surgical changes since previsit or office visit.  KA - temps Olmitz  vitals

## 2018-10-27 NOTE — Progress Notes (Signed)
PT taken to PACU. Monitors in place. VSS. Report given to RN. 

## 2018-10-27 NOTE — Op Note (Signed)
[facility] Patient Name: Stephen Roy Procedure Date: 10/27/2018 1:05 PM MRN: [id number] Endoscopist: Milus Banister , MD Age: 72 Referring MD:  Date of Birth: [birth year] Gender: Male Account #: [id number] Procedure:                Colonoscopy Indications:              High risk colon cancer surveillance: Personal                            history of colonic polyps; Colonoscopy 11/2006 Dr.                            Loreli Dollar [facility]; done for                            "had colonoscopy several years ago with removal of                            some polyps." Findings: a "minute polyp", sigmoi                            diverticulosis. Polyp was a tubular adenoma on                            pathology. He was recommended to have repeat                            colonoscopy in 3 years. Colonoscopy 10/2009 Dr.                            Margart Sickles, same location; done for "patient had an                            adenoma removed 3 years ago and now presents for                            follow up." Findings; diverticulosis. Recommended                            "colonoscopy in 4-5 years would be indicated".                            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 Medicines:                Monitored Anesthesia Care Procedure:                Pre-Anesthesia Assessment:                           - Prior to the procedure, a History and Physical                            was performed, and patient medications and  allergies were reviewed. The patient's tolerance of                            previous anesthesia was also reviewed. The risks                            and benefits of the procedure and the sedation                            options and risks were discussed with the patient.                            All questions were answered, and informed consent                            was obtained. Prior  Anticoagulants: The patient has                            taken no previous anticoagulant or antiplatelet                            agents. ASA Grade Assessment: II - A patient with                            mild systemic disease. After reviewing the risks                            and benefits, the patient was deemed in                            satisfactory condition to undergo the procedure.                           After obtaining informed consent, the colonoscope                            was passed under direct vision. Throughout the                            procedure, the patient's blood pressure, pulse, and                            oxygen saturations were monitored continuously. The                            Colonoscope was introduced through the anus and                            advanced to the the cecum, identified by                            appendiceal orifice and ileocecal valve. The  colonoscopy was performed without difficulty. The                            patient tolerated the procedure well. The quality                            of the bowel preparation was good. The ileocecal                            valve, appendiceal orifice, and rectum were                            photographed. Scope In: 1:34:38 PM Scope Out: 1:47:32 PM Scope Withdrawal Time: 0 hours 9 minutes 31 seconds  Total Procedure Duration: 0 hours 12 minutes 54 seconds  Findings:                 Multiple small and large-mouthed diverticula were                            found in the left colon.                           The exam was otherwise without abnormality on                            direct and retroflexion views. Complications:            No immediate complications. Estimated blood loss:                            None. Estimated Blood Loss:     Estimated blood loss: none. Impression:               - Diverticulosis in the left colon.                            - The examination was otherwise normal on direct                            and retroflexion views.                           - No polyps or cancers. Recommendation:           - Patient has a contact number available for                            emergencies. The signs and symptoms of potential                            delayed complications were discussed with the                            patient. Return to normal activities tomorrow.  Written discharge instructions were provided to the                            patient.                           - Resume previous diet.                           - Continue present medications.                           - Repeat colonoscopy in 5 years for surveillance                            given 33mm SSA 3 years ago. Milus Banister, MD 10/27/2018 1:51:54 PM This report has been signed electronically.

## 2018-10-27 NOTE — Patient Instructions (Signed)
Thank you for allowing [place] to care for you today!  No polyps today!  Recommend next surveillance colonoscopy in 5 years.  Resume previous diet and medications today.  Return to your normal activities tomorrow.     YOU HAD AN ENDOSCOPIC PROCEDURE TODAY AT [facility]:   Refer to the procedure report that was given to you for any specific questions about what was found during the examination.  If the procedure report does not answer your questions, please call your gastroenterologist to clarify.  If you requested that your care partner not be given the details of your procedure findings, then the procedure report has been included in a sealed envelope for you to review at your convenience later.  YOU SHOULD EXPECT: Some feelings of bloating in the abdomen. Passage of more gas than usual.  Walking can help get rid of the air that was put into your GI tract during the procedure and reduce the bloating. If you had a lower endoscopy (such as a colonoscopy or flexible sigmoidoscopy) you may notice spotting of blood in your stool or on the toilet paper. If you underwent a bowel prep for your procedure, you may not have a normal bowel movement for a few days.  Please Note:  You might notice some irritation and congestion in your nose or some drainage.  This is from the oxygen used during your procedure.  There is no need for concern and it should clear up in a day or so.  SYMPTOMS TO REPORT IMMEDIATELY:   Following lower endoscopy (colonoscopy or flexible sigmoidoscopy):  Excessive amounts of blood in the stool  Significant tenderness or worsening of abdominal pains  Swelling of the abdomen that is new, acute  Fever of 100F or higher   For urgent or emergent issues, a gastroenterologist can be reached at any hour by calling [phone].   DIET:  We do recommend a small meal at first, but then you may proceed to your regular diet.  Drink plenty of fluids but you should avoid  alcoholic beverages for 24 hours.  ACTIVITY:  You should plan to take it easy for the rest of today and you should NOT DRIVE or use heavy machinery until tomorrow (because of the sedation medicines used during the test).    FOLLOW UP: Our staff will call the number listed on your records 48-72 hours following your procedure to check on you and address any questions or concerns that you may have regarding the information given to you following your procedure. If we do not reach you, we will leave a message.  We will attempt to reach you two times.  During this call, we will ask if you have developed any symptoms of COVID 19. If you develop any symptoms (ie: fever, flu-like symptoms, shortness of breath, cough etc.) before then, please call [phone].  If you test positive for Covid 19 in the 2 weeks post procedure, please call and report this information to [place].    If any biopsies were taken you will be contacted by phone or by letter within the next 1-3 weeks.  Please call us at [phone] if you have not heard about the biopsies in 3 weeks.    SIGNATURES/CONFIDENTIALITY: You and/or your care partner have signed paperwork which will be entered into your electronic medical record.  These signatures attest to the fact that that the information above on your After Visit Summary has been reviewed and is understood.  Full responsibility  of the confidentiality of this discharge information lies with you and/or your care-partner.

## 2018-10-29 ENCOUNTER — Telehealth: Payer: Self-pay | Admitting: *Deleted

## 2018-10-29 NOTE — Telephone Encounter (Signed)
  Follow up Call-  Call back number 10/27/2018  Post procedure Call Back phone  # [phone]  Permission to leave phone message Yes  Some recent data might be hidden     Patient questions:  Do you have a fever, pain , or abdominal swelling? No. Pain Score  0 *  Have you tolerated food without any problems? Yes.    Have you been able to return to your normal activities? Yes.    Do you have any questions about your discharge instructions: Diet   no Medications  No. Follow up visit  No.  Do you have questions or concerns about your Care? No.  Actions: * If pain score is 4 or above: No action needed, pain <4.  1. Have you developed a fever since your procedure? no  2.   Have you had an respiratory symptoms (SOB or cough) since your procedure? no  3.   Have you tested positive for COVID 19 since your procedure no  4.   Have you had any family members/close contacts diagnosed with the COVID 19 since your procedure?  no   If yes to any of these questions please route to Joylene John, RN and Alphonsa Gin, Therapist, sports.

## 2019-04-18 DIAGNOSIS — M25562 Pain in left knee: Secondary | ICD-10-CM | POA: Diagnosis not present

## 2019-04-18 DIAGNOSIS — M1712 Unilateral primary osteoarthritis, left knee: Secondary | ICD-10-CM | POA: Diagnosis not present

## 2019-05-09 DIAGNOSIS — L578 Other skin changes due to chronic exposure to nonionizing radiation: Secondary | ICD-10-CM | POA: Diagnosis not present

## 2019-05-09 DIAGNOSIS — D0439 Carcinoma in situ of skin of other parts of face: Secondary | ICD-10-CM | POA: Diagnosis not present

## 2019-05-09 DIAGNOSIS — L570 Actinic keratosis: Secondary | ICD-10-CM | POA: Diagnosis not present

## 2019-05-09 DIAGNOSIS — L821 Other seborrheic keratosis: Secondary | ICD-10-CM | POA: Diagnosis not present

## 2019-05-09 DIAGNOSIS — D225 Melanocytic nevi of trunk: Secondary | ICD-10-CM | POA: Diagnosis not present

## 2019-05-09 DIAGNOSIS — L814 Other melanin hyperpigmentation: Secondary | ICD-10-CM | POA: Diagnosis not present

## 2019-05-09 DIAGNOSIS — D492 Neoplasm of unspecified behavior of bone, soft tissue, and skin: Secondary | ICD-10-CM | POA: Diagnosis not present

## 2019-07-05 DIAGNOSIS — D2271 Melanocytic nevi of right lower limb, including hip: Secondary | ICD-10-CM | POA: Diagnosis not present

## 2019-07-05 DIAGNOSIS — D0439 Carcinoma in situ of skin of other parts of face: Secondary | ICD-10-CM | POA: Diagnosis not present

## 2019-07-05 DIAGNOSIS — L821 Other seborrheic keratosis: Secondary | ICD-10-CM | POA: Diagnosis not present

## 2019-07-05 DIAGNOSIS — D2272 Melanocytic nevi of left lower limb, including hip: Secondary | ICD-10-CM | POA: Diagnosis not present

## 2019-07-05 DIAGNOSIS — L570 Actinic keratosis: Secondary | ICD-10-CM | POA: Diagnosis not present

## 2019-07-05 DIAGNOSIS — D225 Melanocytic nevi of trunk: Secondary | ICD-10-CM | POA: Diagnosis not present

## 2019-09-09 DIAGNOSIS — Z125 Encounter for screening for malignant neoplasm of prostate: Secondary | ICD-10-CM | POA: Diagnosis not present

## 2019-09-09 DIAGNOSIS — E7849 Other hyperlipidemia: Secondary | ICD-10-CM | POA: Diagnosis not present

## 2019-09-14 ENCOUNTER — Emergency Department (HOSPITAL_COMMUNITY)
Admission: EM | Admit: 2019-09-14 | Discharge: 2019-09-14 | Discharge status: Against Medical Advice | Payer: Medicare Other

## 2019-09-14 DIAGNOSIS — I350 Nonrheumatic aortic (valve) stenosis: Secondary | ICD-10-CM | POA: Diagnosis not present

## 2019-09-14 DIAGNOSIS — M7918 Myalgia, other site: Secondary | ICD-10-CM | POA: Diagnosis not present

## 2019-09-14 DIAGNOSIS — R972 Elevated prostate specific antigen [PSA]: Secondary | ICD-10-CM | POA: Diagnosis not present

## 2019-09-14 DIAGNOSIS — M545 Low back pain: Secondary | ICD-10-CM | POA: Diagnosis not present

## 2019-09-14 DIAGNOSIS — W010XXA Fall on same level from slipping, tripping and stumbling without subsequent striking against object, initial encounter: Secondary | ICD-10-CM | POA: Diagnosis not present

## 2019-09-14 DIAGNOSIS — I10 Essential (primary) hypertension: Secondary | ICD-10-CM | POA: Diagnosis not present

## 2019-09-14 DIAGNOSIS — M47816 Spondylosis without myelopathy or radiculopathy, lumbar region: Secondary | ICD-10-CM | POA: Diagnosis not present

## 2019-09-14 DIAGNOSIS — Z0000 Encounter for general adult medical examination without abnormal findings: Secondary | ICD-10-CM | POA: Diagnosis not present

## 2019-09-14 DIAGNOSIS — M5136 Other intervertebral disc degeneration, lumbar region: Secondary | ICD-10-CM | POA: Diagnosis not present

## 2019-09-14 DIAGNOSIS — M47896 Other spondylosis, lumbar region: Secondary | ICD-10-CM | POA: Diagnosis not present

## 2019-09-14 DIAGNOSIS — R82998 Other abnormal findings in urine: Secondary | ICD-10-CM | POA: Diagnosis not present

## 2019-09-14 DIAGNOSIS — N529 Male erectile dysfunction, unspecified: Secondary | ICD-10-CM | POA: Diagnosis not present

## 2019-09-14 DIAGNOSIS — E785 Hyperlipidemia, unspecified: Secondary | ICD-10-CM | POA: Diagnosis not present

## 2019-09-22 DIAGNOSIS — M7918 Myalgia, other site: Secondary | ICD-10-CM | POA: Diagnosis not present

## 2019-09-22 DIAGNOSIS — M545 Low back pain: Secondary | ICD-10-CM | POA: Diagnosis not present

## 2019-09-26 DIAGNOSIS — M545 Low back pain: Secondary | ICD-10-CM | POA: Diagnosis not present

## 2019-09-27 DIAGNOSIS — M9905 Segmental and somatic dysfunction of pelvic region: Secondary | ICD-10-CM | POA: Diagnosis not present

## 2019-09-27 DIAGNOSIS — M9901 Segmental and somatic dysfunction of cervical region: Secondary | ICD-10-CM | POA: Diagnosis not present

## 2019-09-27 DIAGNOSIS — M9903 Segmental and somatic dysfunction of lumbar region: Secondary | ICD-10-CM | POA: Diagnosis not present

## 2019-09-27 DIAGNOSIS — M9902 Segmental and somatic dysfunction of thoracic region: Secondary | ICD-10-CM | POA: Diagnosis not present

## 2019-10-02 DIAGNOSIS — Z23 Encounter for immunization: Secondary | ICD-10-CM | POA: Diagnosis not present

## 2019-10-03 DIAGNOSIS — H524 Presbyopia: Secondary | ICD-10-CM | POA: Diagnosis not present

## 2019-10-03 DIAGNOSIS — M9905 Segmental and somatic dysfunction of pelvic region: Secondary | ICD-10-CM | POA: Diagnosis not present

## 2019-10-03 DIAGNOSIS — H2513 Age-related nuclear cataract, bilateral: Secondary | ICD-10-CM | POA: Diagnosis not present

## 2019-10-03 DIAGNOSIS — M9902 Segmental and somatic dysfunction of thoracic region: Secondary | ICD-10-CM | POA: Diagnosis not present

## 2019-10-03 DIAGNOSIS — H5213 Myopia, bilateral: Secondary | ICD-10-CM | POA: Diagnosis not present

## 2019-10-03 DIAGNOSIS — M9901 Segmental and somatic dysfunction of cervical region: Secondary | ICD-10-CM | POA: Diagnosis not present

## 2019-10-03 DIAGNOSIS — M9903 Segmental and somatic dysfunction of lumbar region: Secondary | ICD-10-CM | POA: Diagnosis not present

## 2019-10-05 DIAGNOSIS — Z125 Encounter for screening for malignant neoplasm of prostate: Secondary | ICD-10-CM | POA: Diagnosis not present

## 2019-10-24 DIAGNOSIS — M1712 Unilateral primary osteoarthritis, left knee: Secondary | ICD-10-CM | POA: Diagnosis not present

## 2019-10-24 DIAGNOSIS — M25562 Pain in left knee: Secondary | ICD-10-CM | POA: Diagnosis not present

## 2019-11-22 DIAGNOSIS — Z20822 Contact with and (suspected) exposure to covid-19: Secondary | ICD-10-CM | POA: Diagnosis not present

## 2019-12-19 DIAGNOSIS — D225 Melanocytic nevi of trunk: Secondary | ICD-10-CM | POA: Diagnosis not present

## 2019-12-19 DIAGNOSIS — Z85828 Personal history of other malignant neoplasm of skin: Secondary | ICD-10-CM | POA: Diagnosis not present

## 2019-12-19 DIAGNOSIS — L812 Freckles: Secondary | ICD-10-CM | POA: Diagnosis not present

## 2019-12-19 DIAGNOSIS — D2371 Other benign neoplasm of skin of right lower limb, including hip: Secondary | ICD-10-CM | POA: Diagnosis not present

## 2019-12-19 DIAGNOSIS — L821 Other seborrheic keratosis: Secondary | ICD-10-CM | POA: Diagnosis not present

## 2019-12-19 DIAGNOSIS — L570 Actinic keratosis: Secondary | ICD-10-CM | POA: Diagnosis not present

## 2020-03-02 DIAGNOSIS — M2041 Other hammer toe(s) (acquired), right foot: Secondary | ICD-10-CM | POA: Diagnosis not present

## 2020-03-02 DIAGNOSIS — L84 Corns and callosities: Secondary | ICD-10-CM | POA: Diagnosis not present

## 2020-03-02 DIAGNOSIS — M79674 Pain in right toe(s): Secondary | ICD-10-CM | POA: Diagnosis not present

## 2020-03-03 DIAGNOSIS — U071 COVID-19: Secondary | ICD-10-CM | POA: Diagnosis not present

## 2020-07-19 DIAGNOSIS — M7061 Trochanteric bursitis, right hip: Secondary | ICD-10-CM | POA: Diagnosis not present

## 2020-07-19 DIAGNOSIS — M7062 Trochanteric bursitis, left hip: Secondary | ICD-10-CM | POA: Diagnosis not present

## 2020-07-19 DIAGNOSIS — M160 Bilateral primary osteoarthritis of hip: Secondary | ICD-10-CM | POA: Diagnosis not present

## 2020-08-02 DIAGNOSIS — Z23 Encounter for immunization: Secondary | ICD-10-CM | POA: Diagnosis not present

## 2020-08-24 DIAGNOSIS — S0101XA Laceration without foreign body of scalp, initial encounter: Secondary | ICD-10-CM | POA: Diagnosis not present

## 2020-08-24 DIAGNOSIS — Z7182 Exercise counseling: Secondary | ICD-10-CM | POA: Diagnosis not present

## 2020-08-31 DIAGNOSIS — S0101XS Laceration without foreign body of scalp, sequela: Secondary | ICD-10-CM | POA: Diagnosis not present

## 2020-08-31 DIAGNOSIS — I10 Essential (primary) hypertension: Secondary | ICD-10-CM | POA: Diagnosis not present

## 2020-08-31 DIAGNOSIS — Z6826 Body mass index (BMI) 26.0-26.9, adult: Secondary | ICD-10-CM | POA: Diagnosis not present

## 2020-09-18 DIAGNOSIS — Z20822 Contact with and (suspected) exposure to covid-19: Secondary | ICD-10-CM | POA: Diagnosis not present

## 2020-09-26 DIAGNOSIS — E785 Hyperlipidemia, unspecified: Secondary | ICD-10-CM | POA: Diagnosis not present

## 2020-09-26 DIAGNOSIS — Z125 Encounter for screening for malignant neoplasm of prostate: Secondary | ICD-10-CM | POA: Diagnosis not present

## 2020-09-26 DIAGNOSIS — I10 Essential (primary) hypertension: Secondary | ICD-10-CM | POA: Diagnosis not present

## 2020-10-03 DIAGNOSIS — I10 Essential (primary) hypertension: Secondary | ICD-10-CM | POA: Diagnosis not present

## 2020-10-03 DIAGNOSIS — I350 Nonrheumatic aortic (valve) stenosis: Secondary | ICD-10-CM | POA: Diagnosis not present

## 2020-10-03 DIAGNOSIS — R972 Elevated prostate specific antigen [PSA]: Secondary | ICD-10-CM | POA: Diagnosis not present

## 2020-10-03 DIAGNOSIS — Z0000 Encounter for general adult medical examination without abnormal findings: Secondary | ICD-10-CM | POA: Diagnosis not present

## 2020-10-03 DIAGNOSIS — Z1331 Encounter for screening for depression: Secondary | ICD-10-CM | POA: Diagnosis not present

## 2020-10-03 DIAGNOSIS — E785 Hyperlipidemia, unspecified: Secondary | ICD-10-CM | POA: Diagnosis not present

## 2020-10-03 DIAGNOSIS — M25559 Pain in unspecified hip: Secondary | ICD-10-CM | POA: Diagnosis not present

## 2020-10-03 DIAGNOSIS — Z1339 Encounter for screening examination for other mental health and behavioral disorders: Secondary | ICD-10-CM | POA: Diagnosis not present

## 2020-10-03 DIAGNOSIS — N529 Male erectile dysfunction, unspecified: Secondary | ICD-10-CM | POA: Diagnosis not present

## 2020-10-03 DIAGNOSIS — R82998 Other abnormal findings in urine: Secondary | ICD-10-CM | POA: Diagnosis not present

## 2020-10-08 DIAGNOSIS — H2513 Age-related nuclear cataract, bilateral: Secondary | ICD-10-CM | POA: Diagnosis not present

## 2020-10-08 DIAGNOSIS — H524 Presbyopia: Secondary | ICD-10-CM | POA: Diagnosis not present

## 2020-10-10 DIAGNOSIS — M25551 Pain in right hip: Secondary | ICD-10-CM | POA: Diagnosis not present

## 2020-10-10 DIAGNOSIS — M7061 Trochanteric bursitis, right hip: Secondary | ICD-10-CM | POA: Diagnosis not present

## 2020-10-10 DIAGNOSIS — M7062 Trochanteric bursitis, left hip: Secondary | ICD-10-CM | POA: Diagnosis not present

## 2020-11-07 DIAGNOSIS — M25551 Pain in right hip: Secondary | ICD-10-CM | POA: Diagnosis not present

## 2020-11-07 DIAGNOSIS — M25552 Pain in left hip: Secondary | ICD-10-CM | POA: Diagnosis not present

## 2020-11-19 DIAGNOSIS — L812 Freckles: Secondary | ICD-10-CM | POA: Diagnosis not present

## 2020-11-19 DIAGNOSIS — D485 Neoplasm of uncertain behavior of skin: Secondary | ICD-10-CM | POA: Diagnosis not present

## 2020-11-19 DIAGNOSIS — D1801 Hemangioma of skin and subcutaneous tissue: Secondary | ICD-10-CM | POA: Diagnosis not present

## 2020-11-19 DIAGNOSIS — L821 Other seborrheic keratosis: Secondary | ICD-10-CM | POA: Diagnosis not present

## 2020-11-19 DIAGNOSIS — Z85828 Personal history of other malignant neoplasm of skin: Secondary | ICD-10-CM | POA: Diagnosis not present

## 2020-11-19 DIAGNOSIS — L570 Actinic keratosis: Secondary | ICD-10-CM | POA: Diagnosis not present

## 2020-12-17 ENCOUNTER — Encounter: Payer: Self-pay | Admitting: Cardiology

## 2020-12-17 ENCOUNTER — Other Ambulatory Visit: Payer: Self-pay

## 2020-12-17 ENCOUNTER — Ambulatory Visit (INDEPENDENT_AMBULATORY_CARE_PROVIDER_SITE_OTHER): Payer: Medicare Other | Admitting: Cardiology

## 2020-12-17 VITALS — BP 142/70 | HR 62 | Ht 75.0 in | Wt 209.0 lb

## 2020-12-17 DIAGNOSIS — R001 Bradycardia, unspecified: Secondary | ICD-10-CM

## 2020-12-17 NOTE — Progress Notes (Signed)
Electrophysiology Office Note   Date:  12/17/2020   ID:  Stephen Roy, DOB Nov 04, 1945, MRN [id number]  PCP:  Stephen Hatchet, MD  Cardiologist:   Primary Electrophysiologist:  Stephen Barnfield Meredith Leeds, MD    No chief complaint on file.    History of Present Illness: Stephen Roy is a 75 y.o. male who is being seen today for the evaluation of heart block at the request of Stephen Hatchet, MD. Presenting today for electrophysiology evaluation.    He has a history significant for hypertension and hyperlipidemia.  July 2019 he had an episode of weakness and fatigue.  This is associated with near syncope.  EMS was called but he noted that bradycardia with heart rate in the 40s.  Ambulated and felt well.    Today, denies symptoms of palpitations, chest pain, shortness of breath, orthopnea, PND, lower extremity edema, claudication, dizziness, presyncope, syncope, bleeding, or neurologic sequela. The patient is tolerating medications without difficulties.  Approximately 2 weeks ago, he had more episodes of dizziness.  This lasted for a few weeks and has since gone away.  He was able to continue to do all of his daily activities, but his episodes felt similar to his episodes in 2019.  He feels well and has no major complaints.  He is able to do all of his daily activities and was not limited when he was having his episodes of dizziness.  There are no exacerbating or alleviating factors.  He felt that they were similar to a vertigo.   Past Medical History:  Diagnosis Date   Abnormal EKG    Bradycardia    Dizziness    Hyperlipidemia    Hypertension    Past Surgical History:  Procedure Laterality Date   CLOSED REDUCTION HAND FRACTURE Right 1961   COLONOSCOPY  10/2015   jacobs hx polyps   FRACTURE SURGERY Left 2001   shoulder   TONSILLECTOMY  10/1949     Current Outpatient Medications  Medication Sig Dispense Refill   aspirin 81 MG chewable tablet Chew 81 mg by mouth daily.       atorvastatin (LIPITOR) 10 MG tablet Take 10 mg by mouth daily.     irbesartan (AVAPRO) 150 MG tablet Take 150 mg by mouth every morning.      Multiple Vitamin (MULTIVITAMIN) tablet Take 1 tablet by mouth daily.     Omega-3 Fatty Acids (FISH OIL PO) Take 1 capsule by mouth daily.      No current facility-administered medications for this visit.    Allergies:   Patient has no known allergies.   Social History:  The patient  reports that he has never smoked. He has never used smokeless tobacco. He reports current alcohol use of about 14.0 standard drinks per week. He reports that he does not use drugs.   Family History:  The patient's family history includes Heart disease in his father.   ROS:  Please see the history of present illness.   Otherwise, review of systems is positive for none.   All other systems are reviewed and negative.   PHYSICAL EXAM: VS:  BP (!) 142/70   Pulse 62   Ht 6\' 3"  (1.905 m)   Wt 209 lb (94.8 kg)   SpO2 97%   BMI 26.12 kg/m  , BMI Body mass index is 26.12 kg/m. GEN: Well nourished, well developed, in no acute distress  HEENT: normal  Neck: no JVD, carotid bruits, or masses Cardiac: RRR; no murmurs, rubs, or  gallops,no edema  Respiratory:  clear to auscultation bilaterally, normal work of breathing GI: soft, nontender, nondistended, + BS MS: no deformity or atrophy  Skin: warm and dry Neuro:  Strength and sensation are intact Psych: euthymic mood, full affect  EKG:  EKG is ordered today. Personal review of the ekg ordered shows sinus rhythm, rate 62   Recent Labs: No results found for requested labs within last 8760 hours.    Lipid Panel  No results found for: CHOL, TRIG, HDL, CHOLHDL, VLDL, LDLCALC, LDLDIRECT   Wt Readings from Last 3 Encounters:  12/17/20 209 lb (94.8 kg)  10/27/18 206 lb (93.4 kg)  10/11/18 210 lb 6.4 oz (95.4 kg)      Other studies Reviewed: Additional studies/ records that were reviewed today include: TTE  09/03/2017 Review of the above records today demonstrates:  - Left ventricle: The cavity size was normal. Wall thickness was   increased in a pattern of mild LVH. Systolic function was normal.   The estimated ejection fraction was in the range of 60% to 65%.   Wall motion was normal; there were no regional wall motion   abnormalities. Left ventricular diastolic function parameters   were normal. - Aortic valve: Valve mobility was restricted. 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 There was mild   regurgitation. Valve area (Vmax): 0 cm^2. Valve area (Vmean): 0   cm^2. - Mitral valve: Transvalvular velocity was within the normal range.   There was no evidence for stenosis. There was no regurgitation. - Right ventricle: The cavity size was normal. Wall thickness was   normal. Systolic function was normal. - Right atrium: The atrium was moderately dilated. - Tricuspid valve: There was no regurgitation. - Pulmonary arteries: Systolic pressure was within the normal   range.   ASSESSMENT AND PLAN:  1.  Sinus bradycardia: He is unclear if he has had any further episodes of bradycardia.  He did have some dizziness approximately a month ago.  His dizziness has since improved.  He is planning to travel to [place] for procedure.  If he does have more episodes of dizziness, he Stephen Roy call us back and we Athina Fahey send him a 2-week monitor.  Current medicines are reviewed at length with the patient today.   The patient does not have concerns regarding his medicines.  The following changes were made today:  none  Labs/ tests ordered today include:  Orders Placed This Encounter  Procedures   EKG 12-Lead      Disposition:   FU with Stephen Roy PRN  Signed, Stephen Clemence Meredith Leeds, MD  12/17/2020 11:23 AM     [address] [place] [address] [phone] (office) [phone] (fax)

## 2020-12-25 DIAGNOSIS — Z23 Encounter for immunization: Secondary | ICD-10-CM | POA: Diagnosis not present

## 2021-01-02 DIAGNOSIS — M7061 Trochanteric bursitis, right hip: Secondary | ICD-10-CM | POA: Diagnosis not present

## 2021-01-02 DIAGNOSIS — M7062 Trochanteric bursitis, left hip: Secondary | ICD-10-CM | POA: Diagnosis not present

## 2021-02-01 DIAGNOSIS — R059 Cough, unspecified: Secondary | ICD-10-CM | POA: Diagnosis not present

## 2021-05-08 DIAGNOSIS — Z20822 Contact with and (suspected) exposure to covid-19: Secondary | ICD-10-CM | POA: Diagnosis not present

## 2021-07-03 DIAGNOSIS — M160 Bilateral primary osteoarthritis of hip: Secondary | ICD-10-CM | POA: Diagnosis not present

## 2021-10-02 DIAGNOSIS — Z125 Encounter for screening for malignant neoplasm of prostate: Secondary | ICD-10-CM | POA: Diagnosis not present

## 2021-10-02 DIAGNOSIS — I10 Essential (primary) hypertension: Secondary | ICD-10-CM | POA: Diagnosis not present

## 2021-10-02 DIAGNOSIS — E785 Hyperlipidemia, unspecified: Secondary | ICD-10-CM | POA: Diagnosis not present

## 2021-10-09 DIAGNOSIS — I10 Essential (primary) hypertension: Secondary | ICD-10-CM | POA: Diagnosis not present

## 2021-10-09 DIAGNOSIS — E785 Hyperlipidemia, unspecified: Secondary | ICD-10-CM | POA: Diagnosis not present

## 2021-10-09 DIAGNOSIS — Z1339 Encounter for screening examination for other mental health and behavioral disorders: Secondary | ICD-10-CM | POA: Diagnosis not present

## 2021-10-09 DIAGNOSIS — I350 Nonrheumatic aortic (valve) stenosis: Secondary | ICD-10-CM | POA: Diagnosis not present

## 2021-10-09 DIAGNOSIS — R972 Elevated prostate specific antigen [PSA]: Secondary | ICD-10-CM | POA: Diagnosis not present

## 2021-10-09 DIAGNOSIS — Z1331 Encounter for screening for depression: Secondary | ICD-10-CM | POA: Diagnosis not present

## 2021-10-09 DIAGNOSIS — M25559 Pain in unspecified hip: Secondary | ICD-10-CM | POA: Diagnosis not present

## 2021-10-09 DIAGNOSIS — R001 Bradycardia, unspecified: Secondary | ICD-10-CM | POA: Diagnosis not present

## 2021-10-09 DIAGNOSIS — N529 Male erectile dysfunction, unspecified: Secondary | ICD-10-CM | POA: Diagnosis not present

## 2021-10-09 DIAGNOSIS — Z0000 Encounter for general adult medical examination without abnormal findings: Secondary | ICD-10-CM | POA: Diagnosis not present

## 2021-10-11 ENCOUNTER — Telehealth: Payer: Self-pay | Admitting: Cardiology

## 2021-10-11 NOTE — Telephone Encounter (Signed)
Pt had his annual physical Wed, his HR was 42.  EKG confirmed and the doctor asked that he let Dr. Curt Bears know.   Pt states BP was higher 150-160/ than normal and so PCP added hctz 12.5 mg daily.  PT reports he feels pretty good.  Denies fatigue, lightheadedness but wanted to pass on that this rate is noted to be low again.  HR was 63 and 77 when he took it this am. Historically HR has been in 43s.    I adv we would let Dr. Curt Bears know and will call him with any recommendations.  If he needs a monitor placed he would prefer to come in the office to have it.  He is aware Dr. Curt Bears is out of the office until next week and we will call him after that.

## 2021-10-11 NOTE — Telephone Encounter (Signed)
STAT if HR is under 50 or over 120 (normal HR is 60-100 beats per minute)  What is your heart rate? 42, 43  Do you have a log of your heart rate readings (document readings)?    Do you have any other symptoms?

## 2021-10-15 NOTE — Telephone Encounter (Signed)
Left message to call back  

## 2021-10-23 NOTE — Progress Notes (Signed)
PCP:  Velna Hatchet, MD Primary Cardiologist: None Electrophysiologist: Will Meredith Leeds, MD   Stephen Roy is a 76 y.o. male seen today for Will Meredith Leeds, MD for acute visit due to bradycardia noted at annual physical.    Patient reports overall he is doing well. No focal symptoms. Overall feels more fatigue, but in a broad type of way. The example he gives is this is the first year of his life skiing where he felt his skill has stagnated and did not improve. Otherwise, remains very active without issue. Started on HCTz recently for BP. Systolic ranging 166-063K at home. HRs in 40-50s in the morning, occasional "LOW" reading on home BP cuff, which he thinks correlates with HR <40. he denies chest pain, palpitations, dyspnea, PND, orthopnea, nausea, vomiting, dizziness, syncope, edema, weight gain, or early satiety.   Past Medical History:  Diagnosis Date   Abnormal EKG    Bradycardia    Dizziness    Hyperlipidemia    Hypertension    Past Surgical History:  Procedure Laterality Date   CLOSED REDUCTION HAND FRACTURE Right 1961   COLONOSCOPY  10/2015   jacobs hx polyps   FRACTURE SURGERY Left 2001   shoulder   TONSILLECTOMY  10/1949    Current Outpatient Medications  Medication Sig Dispense Refill   aspirin 81 MG chewable tablet Chew 81 mg by mouth daily.      atorvastatin (LIPITOR) 10 MG tablet Take 10 mg by mouth daily.     hydrochlorothiazide (HYDRODIURIL) 12.5 MG tablet Take 12.5 mg by mouth daily.     irbesartan (AVAPRO) 150 MG tablet Take 150 mg by mouth every morning.      Multiple Vitamin (MULTIVITAMIN) tablet Take 1 tablet by mouth daily.     Omega-3 Fatty Acids (FISH OIL PO) Take 1 capsule by mouth daily.      No current facility-administered medications for this visit.    No Known Allergies  Social History   Socioeconomic History   Marital status: Married    Spouse name: Not on file   Number of children: Not on file   Years of education: Not  on file   Highest education level: Not on file  Occupational History   Not on file  Tobacco Use   Smoking status: Never   Smokeless tobacco: Never  Vaping Use   Vaping Use: Never used  Substance and Sexual Activity   Alcohol use: Yes    Alcohol/week: 14.0 standard drinks of alcohol    Types: 14 Standard drinks or equivalent per week   Drug use: No   Sexual activity: Not on file  Other Topics Concern   Not on file  Social History Narrative   Not on file   Social Determinants of Health   Financial Resource Strain: Not on file  Food Insecurity: Not on file  Transportation Needs: Not on file  Physical Activity: Not on file  Stress: Not on file  Social Connections: Not on file  Intimate Partner Violence: Not on file     Review of Systems: All other systems reviewed and are otherwise negative except as noted above.  Physical Exam: Vitals:   10/24/21 1226  BP: 138/78  Pulse: (!) 52  Weight: 208 lb (94.3 kg)  Height: '6\' 2"'$  (1.88 m)    GEN- The patient is well appearing, alert and oriented x 3 today.   HEENT: normocephalic, atraumatic; sclera clear, conjunctiva pink; hearing intact; oropharynx clear; neck supple, no JVP Lymph- no  cervical lymphadenopathy Lungs- Clear to ausculation bilaterally, normal work of breathing.  No wheezes, rales, rhonchi Heart- Regular rate and rhythm, no murmurs, rubs or gallops, PMI not laterally displaced GI- soft, non-tender, non-distended, bowel sounds present, no hepatosplenomegaly Extremities- No peripheral edema. no clubbing or cyanosis; DP/PT/radial pulses 2+ bilaterally MS- no significant deformity or atrophy Skin- warm and dry, no rash or lesion Psych- euthymic mood, full affect Neuro- strength and sensation are intact  EKG is ordered. Personal review of EKG from today shows sinus bradycardia at 52 bpm, 1st degree AV block at 206 ms  Additional studies reviewed include: Previous EP office notes.   Assessment and Plan:  1.  Sinus bradycardia Overall, asymptomatic. No further dizziness or lightheadedness as he has had in the past.  Documented HRs in 40s at home at times, at rest, especially in mornings. With occasional readings of "Low" on his BP cuff.  For completeness, we will place 7 day Zio. Suspect watchful waiting will be recommending. If he has symptomatic bradycardia or symptomatic sinus node dysfunction, can discuss pacing.   Follow up with EP APP in 4 weeks to review results at pt request.   Shirley Friar, PA-C  10/23/21 11:57 AM

## 2021-10-24 ENCOUNTER — Ambulatory Visit (INDEPENDENT_AMBULATORY_CARE_PROVIDER_SITE_OTHER): Payer: Medicare Other

## 2021-10-24 ENCOUNTER — Encounter: Payer: Self-pay | Admitting: Student

## 2021-10-24 ENCOUNTER — Ambulatory Visit: Payer: Medicare Other | Attending: Student | Admitting: Student

## 2021-10-24 VITALS — BP 138/78 | HR 52 | Ht 74.0 in | Wt 208.0 lb

## 2021-10-24 DIAGNOSIS — R001 Bradycardia, unspecified: Secondary | ICD-10-CM | POA: Diagnosis not present

## 2021-10-24 NOTE — Progress Notes (Unsigned)
ZIO XT serial # [id number] from office inventory applied to patient.  Dr. Curt Bears to read.

## 2021-10-24 NOTE — Patient Instructions (Addendum)
Medication Instructions:  Your physician recommends that you continue on your current medications as directed. Please refer to the Current Medication list given to you today.  *If you need a refill on your cardiac medications before your next appointment, please call your pharmacy*   Lab Work: None If you have labs (blood work) drawn today and your tests are completely normal, you will receive your results only by: [place] (if you have MyChart) OR A paper copy in the mail If you have any lab test that is abnormal or we need to change your treatment, we will call you to review the results.   Follow-Up: At [facility], you and your health needs are our priority.  As part of our continuing mission to provide you with exceptional heart care, we have created designated Provider Care Teams.  These Care Teams include your primary Cardiologist (physician) and Advanced Practice Providers (APPs -  Physician Assistants and Nurse Practitioners) who all work together to provide you with the care you need, when you need it.  We recommend signing up for the patient portal called "MyChart".  Sign up information is provided on this After Visit Summary.  MyChart is used to connect with patients for Virtual Visits (Telemedicine).  Patients are able to view lab/test results, encounter notes, upcoming appointments, etc.  Non-urgent messages can be sent to your provider as well.   To learn more about what you can do with MyChart, go to [email or web address].    Your next appointment:   As scheduled  ZIO XT- Long Term Monitor Instructions  Your physician has requested you wear a ZIO patch monitor for 7 days.  This is a single patch monitor. [employer] supplies one patch monitor per enrollment. Additional stickers are not available. Please do not apply patch if you will be having a Nuclear Stress Test,  Echocardiogram, Cardiac CT, MRI, or Chest Xray during the period you would be wearing the   monitor. The patch cannot be worn during these tests. You cannot remove and re-apply the  ZIO XT patch monitor.  Your ZIO patch monitor will be mailed 3 day USPS to your address on file. It may take 3-5 days  to receive your monitor after you have been enrolled.  Once you have received your monitor, please review the enclosed instructions. Your monitor  has already been registered assigning a specific monitor serial # to you.  Billing and Patient Assistance Program Information  We have supplied [employer] with any of your insurance information on file for billing purposes. [employer] offers a sliding scale Patient Assistance Program for patients that do not have  insurance, or whose insurance does not completely cover the cost of the ZIO monitor.  You must apply for the Patient Assistance Program to qualify for this discounted rate.  To apply, please call [employer] at [phone], select option 4, select option 2, ask to apply for  Patient Assistance Program. Theodore Demark will ask your household income, and how many people  are in your household. They will quote your out-of-pocket cost based on that information.  [employer] will also be able to set up a 17-month interest-free payment plan if needed.  Applying the monitor   Shave hair from upper left chest.  Hold abrader disc by orange tab. Rub abrader in 40 strokes over the upper left chest as  indicated in your monitor instructions.  Clean area with 4 enclosed alcohol pads. Let dry.  Apply patch as indicated in monitor instructions. Patch will be  placed under collarbone on left  side of chest with arrow pointing upward.  Rub patch adhesive wings for 2 minutes. Remove white label marked "1". Remove the white  label marked "2". Rub patch adhesive wings for 2 additional minutes.  While looking in a mirror, press and release button in center of patch. A small green light will  flash 3-4 times. This will be your only indicator that the monitor has been  turned on.  Do not shower for the first 24 hours. You may shower after the first 24 hours.  Press the button if you feel a symptom. You will hear a small click. Record Date, Time and  Symptom in the Patient Logbook.  When you are ready to remove the patch, follow instructions on the last 2 pages of Patient  Logbook. Stick patch monitor onto the last page of Patient Logbook.  Place Patient Logbook in the blue and white box. Use locking tab on box and tape box closed  securely. The blue and white box has prepaid postage on it. Please place it in the mailbox as  soon as possible. Your physician should have your test results approximately 7 days after the  monitor has been mailed back to [facility].  Call [place] at [phone] if you have questions regarding  your ZIO XT patch monitor. Call them immediately if you see an orange light blinking on your  monitor.  If your monitor falls off in less than 4 days, contact our Monitor department at [phone].  If your monitor becomes loose or falls off after 4 days call [employer] at [phone] for  suggestions on securing your monitor

## 2021-10-28 DIAGNOSIS — H524 Presbyopia: Secondary | ICD-10-CM | POA: Diagnosis not present

## 2021-10-28 DIAGNOSIS — H2513 Age-related nuclear cataract, bilateral: Secondary | ICD-10-CM | POA: Diagnosis not present

## 2021-11-08 DIAGNOSIS — R001 Bradycardia, unspecified: Secondary | ICD-10-CM | POA: Diagnosis not present

## 2021-11-15 ENCOUNTER — Other Ambulatory Visit: Payer: Self-pay

## 2021-11-15 DIAGNOSIS — R001 Bradycardia, unspecified: Secondary | ICD-10-CM

## 2021-11-15 DIAGNOSIS — I493 Ventricular premature depolarization: Secondary | ICD-10-CM

## 2021-11-18 ENCOUNTER — Telehealth (HOSPITAL_COMMUNITY): Payer: Self-pay | Admitting: Cardiology

## 2021-11-18 NOTE — Progress Notes (Unsigned)
PCP:  Velna Hatchet, MD Primary Cardiologist: None Electrophysiologist: Will Meredith Leeds, MD   Stephen Roy is a 76 y.o. male seen today for Will Meredith Leeds, MD for routine EP follow up.   Seen last month with fatigue and reports of low HRs at home. Monitor worn which showed no significant bradycardia but >25% PVCs.  Patient reports overall he is doing well. No focal symptoms. Overall feels more fatigue, but in a broad type of way. The example he gives is this is the first year of his life skiing where he felt his skill has stagnated and did not improve. Otherwise, remains very active without issue. Started on HCTz recently for BP. Systolic ranging [id number] at home. HRs in 40-50s in the morning, occasional "LOW" reading on home BP cuff, which he thinks correlates with HR <40. he denies chest pain, palpitations, dyspnea, PND, orthopnea, nausea, vomiting, dizziness, syncope, edema, weight gain, or early satiety.   Past Medical History:  Diagnosis Date   Abnormal EKG    Bradycardia    Dizziness    Hyperlipidemia    Hypertension    Past Surgical History:  Procedure Laterality Date   CLOSED REDUCTION HAND FRACTURE Right 1961   COLONOSCOPY  10/2015   jacobs hx polyps   FRACTURE SURGERY Left 2001   shoulder   TONSILLECTOMY  10/1949    Current Outpatient Medications  Medication Sig Dispense Refill   aspirin 81 MG chewable tablet Chew 81 mg by mouth daily.      atorvastatin (LIPITOR) 10 MG tablet Take 10 mg by mouth daily.     hydrochlorothiazide (HYDRODIURIL) 12.5 MG tablet Take 12.5 mg by mouth daily.     irbesartan (AVAPRO) 150 MG tablet Take 150 mg by mouth every morning.      Multiple Vitamin (MULTIVITAMIN) tablet Take 1 tablet by mouth daily.     Omega-3 Fatty Acids (FISH OIL PO) Take 1 capsule by mouth daily.      sildenafil (VIAGRA) 100 MG tablet Take 1 tablet by mouth daily as needed.     No current facility-administered medications for this visit.    No  Known Allergies  Social History   Socioeconomic History   Marital status: Married    Spouse name: Not on file   Number of children: Not on file   Years of education: Not on file   Highest education level: Not on file  Occupational History   Not on file  Tobacco Use   Smoking status: Never   Smokeless tobacco: Never  Vaping Use   Vaping Use: Never used  Substance and Sexual Activity   Alcohol use: Yes    Alcohol/week: 14.0 standard drinks of alcohol    Types: 14 Standard drinks or equivalent per week   Drug use: No   Sexual activity: Not on file  Other Topics Concern   Not on file  Social History Narrative   Not on file   Social Determinants of Health   Financial Resource Strain: Not on file  Food Insecurity: Not on file  Transportation Needs: Not on file  Physical Activity: Not on file  Stress: Not on file  Social Connections: Not on file  Intimate Partner Violence: Not on file     Review of Systems: Review of systems complete and found to be negative unless listed in HPI.    Physical Exam: There were no vitals filed for this visit.  General: Pleasant, NAD. No resp difficulty Psych: Normal affect. HEENT:  Normal,  without mass or lesion.         Neck: Supple, no bruits or JVD. Carotids 2+. No lymphadenopathy/thyromegaly appreciated. Heart: PMI nondisplaced. RRR no s3, s4, or murmurs. Lungs:  Resp regular and unlabored, CTA. Abdomen: Soft, non-tender, non-distended, No HSM, BS + x 4.   Extremities: No clubbing, cyanosis or edema. DP/PT/Radials 2+ and equal bilaterally. Neuro: Alert and oriented X 3. Moves all extremities spontaneously.   EKG is ordered. Personal review of EKG from today shows sinus bradycardia at 52 bpm, 1st degree AV block at 206 ms  Additional studies reviewed include: Previous EP office notes.   Monitor 10/2021 Predominant rhythm was sinus rhythm 25.1% ventricular ectopy <1% supraventricular ectopy Lightheaded associated with both  sinus rhythm and sinus rhythm with ventricular bigeminy  Assessment and Plan:  1. Sinus bradycardia Minimum HR on monitor was 44 bpm and nocturnal Symptoms, as above, were with NSR and bigeminal PVCs.  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  Will potentially consider flecainide if EF normal, if EF down, will need to re-discuss with Dr. Curt Bears.   Follow up with EP APP in 4 weeks to review results at pt request.   Shirley Friar, PA-C  11/18/21 12:45 PM

## 2021-11-18 NOTE — Telephone Encounter (Signed)
Pt advised and verbalized understanding:   Yes I have reviewed them and discussed treatment options with Dr Curt Bears.  Plan to discuss with pt at that time   Symptoms were most likely from frequent ectopy (extra beats)   Legrand Como "Oda Kilts, PA-C

## 2021-11-18 NOTE — Telephone Encounter (Signed)
Patient called wanting Oda Kilts to look into incidents he had on Tuesday, 9/19 between 6-9 am and Wednesday, 9/20 between 8-11 am prior to his appointment on Wednesday, 10/11.  Patient stated both mornings he felt lightheaded.

## 2021-11-20 ENCOUNTER — Encounter: Payer: Self-pay | Admitting: Student

## 2021-11-20 ENCOUNTER — Ambulatory Visit (INDEPENDENT_AMBULATORY_CARE_PROVIDER_SITE_OTHER): Payer: Medicare Other

## 2021-11-20 ENCOUNTER — Ambulatory Visit: Payer: Medicare Other | Attending: Student | Admitting: Student

## 2021-11-20 VITALS — BP 138/64 | HR 63 | Ht 74.0 in | Wt 212.0 lb

## 2021-11-20 DIAGNOSIS — R001 Bradycardia, unspecified: Secondary | ICD-10-CM

## 2021-11-20 DIAGNOSIS — I493 Ventricular premature depolarization: Secondary | ICD-10-CM | POA: Diagnosis not present

## 2021-11-20 LAB — ECHOCARDIOGRAM COMPLETE
AR max vel: 1.67 cm2
AV Area VTI: 1.8 cm2
AV Area mean vel: 1.74 cm2
AV Mean grad: 12 mmHg
AV Peak grad: 22.5 mmHg
Ao pk vel: 2.37 m/s
Area-P 1/2: 2.36 cm2
Height: 74 in
MV M vel: 6.18 m/s
MV Peak grad: 152.8 mmHg
Radius: 0.5 cm
S' Lateral: 3.27 cm
Weight: 3392 oz

## 2021-11-20 MED ORDER — FLECAINIDE ACETATE 50 MG PO TABS: 50.0000 mg | ORAL_TABLET | Freq: Two times a day (BID) | ORAL | 3 refills | Status: DC

## 2021-11-20 NOTE — Patient Instructions (Signed)
Medication Instructions:  Your physician has recommended you make the following change in your medication:   START: Flecainide '50mg'$  twice daily  *If you need a refill on your cardiac medications before your next appointment, please call your pharmacy*   Lab Work: None If you have labs (blood work) drawn today and your tests are completely normal, you will receive your results only by: [place] (if you have MyChart) OR A paper copy in the mail If you have any lab test that is abnormal or we need to change your treatment, we will call you to review the results.   Follow-Up: At [facility], you and your health needs are our priority.  As part of our continuing mission to provide you with exceptional heart care, we have created designated Provider Care Teams.  These Care Teams include your primary Cardiologist (physician) and Advanced Practice Providers (APPs -  Physician Assistants and Nurse Practitioners) who all work together to provide you with the care you need, when you need it.  We recommend signing up for the patient portal called "MyChart".  Sign up information is provided on this After Visit Summary.  MyChart is used to connect with patients for Virtual Visits (Telemedicine).  Patients are able to view lab/test results, encounter notes, upcoming appointments, etc.  Non-urgent messages can be sent to your provider as well.   To learn more about what you can do with MyChart, go to [email or web address].    Your next appointment:   As scheduled

## 2021-11-21 ENCOUNTER — Ambulatory Visit: Payer: Medicare Other | Admitting: Student

## 2021-11-25 DIAGNOSIS — L570 Actinic keratosis: Secondary | ICD-10-CM | POA: Diagnosis not present

## 2021-11-25 DIAGNOSIS — D1801 Hemangioma of skin and subcutaneous tissue: Secondary | ICD-10-CM | POA: Diagnosis not present

## 2021-11-25 DIAGNOSIS — L821 Other seborrheic keratosis: Secondary | ICD-10-CM | POA: Diagnosis not present

## 2021-11-25 DIAGNOSIS — Z85828 Personal history of other malignant neoplasm of skin: Secondary | ICD-10-CM | POA: Diagnosis not present

## 2021-11-25 DIAGNOSIS — D225 Melanocytic nevi of trunk: Secondary | ICD-10-CM | POA: Diagnosis not present

## 2021-12-01 NOTE — Progress Notes (Deleted)
PCP:  Velna Hatchet, MD Primary Cardiologist: None Electrophysiologist: Will Meredith Leeds, MD   Stephen Roy is a 76 y.o. male seen today for Will Meredith Leeds, MD for routine electrophysiology followup. Since last being seen in our clinic the patient reports doing ***.  he denies chest pain, palpitations, dyspnea, PND, orthopnea, nausea, vomiting, dizziness, syncope, edema, weight gain, or early satiety.   Past Medical History:  Diagnosis Date   Abnormal EKG    Bradycardia    Dizziness    Hyperlipidemia    Hypertension    Past Surgical History:  Procedure Laterality Date   CLOSED REDUCTION HAND FRACTURE Right 1961   COLONOSCOPY  10/2015   jacobs hx polyps   FRACTURE SURGERY Left 2001   shoulder   TONSILLECTOMY  10/1949    Current Outpatient Medications  Medication Sig Dispense Refill   aspirin 81 MG chewable tablet Chew 81 mg by mouth daily.      atorvastatin (LIPITOR) 10 MG tablet Take 10 mg by mouth daily.     flecainide (TAMBOCOR) 50 MG tablet Take 1 tablet (50 mg total) by mouth 2 (two) times daily. 180 tablet 3   hydrochlorothiazide (HYDRODIURIL) 12.5 MG tablet Take 12.5 mg by mouth daily.     irbesartan (AVAPRO) 150 MG tablet Take 150 mg by mouth every morning.      Multiple Vitamin (MULTIVITAMIN) tablet Take 1 tablet by mouth daily.     Omega-3 Fatty Acids (FISH OIL PO) Take 1 capsule by mouth daily.      sildenafil (VIAGRA) 100 MG tablet Take 1 tablet by mouth daily as needed.     No current facility-administered medications for this visit.    Allergies  Allergen Reactions   Lisinopril     Other reaction(s): impotence   Simvastatin     Other reaction(s): myalgia    Social History   Socioeconomic History   Marital status: Married    Spouse name: Not on file   Number of children: Not on file   Years of education: Not on file   Highest education level: Not on file  Occupational History   Not on file  Tobacco Use   Smoking status: Never    Smokeless tobacco: Never  Vaping Use   Vaping Use: Never used  Substance and Sexual Activity   Alcohol use: Yes    Alcohol/week: 14.0 standard drinks of alcohol    Types: 14 Standard drinks or equivalent per week   Drug use: No   Sexual activity: Not on file  Other Topics Concern   Not on file  Social History Narrative   Not on file   Social Determinants of Health   Financial Resource Strain: Not on file  Food Insecurity: Not on file  Transportation Needs: Not on file  Physical Activity: Not on file  Stress: Not on file  Social Connections: Not on file  Intimate Partner Violence: Not on file     Review of Systems: All other systems reviewed and are otherwise negative except as noted above.  Physical Exam: There were no vitals filed for this visit.  GEN- The patient is well appearing, alert and oriented x 3 today.   HEENT: normocephalic, atraumatic; sclera clear, conjunctiva pink; hearing intact; oropharynx clear; neck supple, no JVP Lymph- no cervical lymphadenopathy Lungs- Clear to ausculation bilaterally, normal work of breathing.  No wheezes, rales, rhonchi Heart- {Blank single:19197::"Regular","Irregularly irregular"} rate and rhythm, no murmurs, rubs or gallops, PMI not laterally displaced GI- soft, non-tender,  non-distended, bowel sounds present, no hepatosplenomegaly Extremities- {EDEMA LVDIX:18550} peripheral edema. no clubbing or cyanosis; DP/PT/radial pulses 2+ bilaterally MS- no significant deformity or atrophy Skin- warm and dry, no rash or lesion Psych- euthymic mood, full affect Neuro- strength and sensation are intact  EKG is ordered. Personal review of EKG from today shows ***  Additional studies reviewed include: Previous EP office notes.   Assessment and Plan:  1. Sinus bradycardia Minimum HR on monitor was 44 bpm and was nocturnal EKG today shows *** on flecainide Symptoms, as above, were with NSR and bigeminal PVCs.   2. Frequent  PVCs Monitoring showed > 25% PVCs Echo showed EF remains normal. Started on flecainide as above.  If becomes bradycardia, will likely have to consider PPM to appropriately treat his PVCs.   Follow up with Dr. Curt Bears in 3 months  Shirley Friar, PA-C  12/01/21 2:13 PM

## 2021-12-04 ENCOUNTER — Ambulatory Visit: Payer: Medicare Other | Attending: Student | Admitting: *Deleted

## 2021-12-04 ENCOUNTER — Ambulatory Visit: Payer: Medicare Other | Admitting: Student

## 2021-12-04 VITALS — HR 57

## 2021-12-04 DIAGNOSIS — I493 Ventricular premature depolarization: Secondary | ICD-10-CM | POA: Insufficient documentation

## 2021-12-04 DIAGNOSIS — R001 Bradycardia, unspecified: Secondary | ICD-10-CM

## 2021-12-04 MED ORDER — FLECAINIDE ACETATE 50 MG PO TABS: 50.0000 mg | ORAL_TABLET | Freq: Two times a day (BID) | ORAL | 3 refills | Status: DC

## 2021-12-04 NOTE — Progress Notes (Signed)
   Nurse Visit   Date of Encounter: 12/04/2021 ID: Stephen Roy, DOB Jan 23, 1946, MRN [id number]  PCP:  Velna Hatchet, [place] Prov[facility] Cardiologist:  None Electrophysiologist:  Will Meredith Leeds, MD      Visit Details   VS:  Pulse (!) 57  , BMI There is no height or weight on file to calculate BMI.  Wt Readings from Last 3 Encounters:  11/20/21 212 lb (96.2 kg)  10/24/21 208 lb (94.3 kg)  12/17/20 209 lb (94.8 kg)     Reason for visit: EKG post Flecainide start Performed today: Vitals, EKG, Provider consulted:Klein, and Education Changes (medications, testing, etc.) : None Length of Visit: 30 minutes    Medications Adjustments/Labs and Tests Ordered: No orders of the defined types were placed in this encounter.  Meds ordered this encounter  Medications   flecainide (TAMBOCOR) 50 MG tablet    Sig: Take 1 tablet (50 mg total) by mouth 2 (two) times daily.    Dispense:  180 tablet    Refill:  3     Signed, Stanton Kidney, RN  12/04/2021 4:24 PM

## 2021-12-25 DIAGNOSIS — Z23 Encounter for immunization: Secondary | ICD-10-CM | POA: Diagnosis not present

## 2021-12-25 DIAGNOSIS — M25552 Pain in left hip: Secondary | ICD-10-CM | POA: Diagnosis not present

## 2021-12-25 DIAGNOSIS — M25551 Pain in right hip: Secondary | ICD-10-CM | POA: Diagnosis not present

## 2021-12-31 ENCOUNTER — Encounter: Payer: Self-pay | Admitting: Cardiology

## 2021-12-31 ENCOUNTER — Ambulatory Visit: Payer: Medicare Other | Attending: Cardiology | Admitting: Cardiology

## 2021-12-31 VITALS — BP 134/78 | HR 68 | Ht 74.0 in | Wt 203.0 lb

## 2021-12-31 DIAGNOSIS — I493 Ventricular premature depolarization: Secondary | ICD-10-CM | POA: Diagnosis not present

## 2021-12-31 NOTE — Patient Instructions (Signed)
Medication Instructions:  Your physician recommends that you continue on your current medications as directed. Please refer to the Current Medication list given to you today.  *If you need a refill on your cardiac medications before your next appointment, please call your pharmacy*   Lab Work: None ordered   Testing/Procedures: None ordered   Follow-Up: At [facility], you and your health needs are our priority.  As part of our continuing mission to provide you with exceptional heart care, we have created designated Provider Care Teams.  These Care Teams include your primary Cardiologist (physician) and Advanced Practice Providers (APPs -  Physician Assistants and Nurse Practitioners) who all work together to provide you with the care you need, when you need it.  We recommend signing up for the patient portal called "MyChart".  Sign up information is provided on this After Visit Summary.  MyChart is used to connect with patients for Virtual Visits (Telemedicine).  Patients are able to view lab/test results, encounter notes, upcoming appointments, etc.  Non-urgent messages can be sent to your provider as well.   To learn more about what you can do with MyChart, go to [email or web address].    Your next appointment:   6 month(s)  The format for your next appointment:   In Person  Provider:   Legrand Como "Jonni Sanger" Chalmers Cater, PA-C    Thank you for choosing [facility]!!   Trinidad Curet, RN [phone]  Other Instructions   Important Information About Sugar

## 2021-12-31 NOTE — Progress Notes (Signed)
Electrophysiology Office Note   Date:  12/31/2021   ID:  Stephen Roy, DOB 07/21/1945, MRN [id number]  PCP:  Velna Hatchet, MD  Cardiologist:   Primary Electrophysiologist:  Evelyn Aguinaldo Meredith Leeds, MD    No chief complaint on file.     History of Present Illness: Stephen Roy is a 76 y.o. male who is being seen today for the evaluation of heart block at the request of Velna Hatchet, MD. Presenting today for electrophysiology evaluation.    History significant for hypertension and hyperlipidemia.  July 2019 he had an episode of weakness and fatigue.  This was associated with near syncope.  EMS was called and he had sinus rhythm in the 40s.  He recently wore a cardiac monitor that showed a 25% PVC burden.  He had an echo that showed a normal ejection fraction.  He was started on flecainide.  Starting flecainide, he has had less fatigue and weakness.  He is able to do more of his daily activities.  Today, denies symptoms of palpitations, chest pain, shortness of breath, orthopnea, PND, lower extremity edema, claudication, dizziness, presyncope, syncope, bleeding, or neurologic sequela. The patient is tolerating medications without difficulties.     Past Medical History:  Diagnosis Date   Abnormal EKG    Bradycardia    Dizziness    Hyperlipidemia    Hypertension    Past Surgical History:  Procedure Laterality Date   CLOSED REDUCTION HAND FRACTURE Right 1961   COLONOSCOPY  10/2015   jacobs hx polyps   FRACTURE SURGERY Left 2001   shoulder   TONSILLECTOMY  10/1949     Current Outpatient Medications  Medication Sig Dispense Refill   aspirin 81 MG chewable tablet Chew 81 mg by mouth daily.      atorvastatin (LIPITOR) 10 MG tablet Take 10 mg by mouth daily.     flecainide (TAMBOCOR) 50 MG tablet Take 1 tablet (50 mg total) by mouth 2 (two) times daily. 180 tablet 3   hydrochlorothiazide (HYDRODIURIL) 12.5 MG tablet Take 12.5 mg by mouth daily.     irbesartan (AVAPRO)  150 MG tablet Take 150 mg by mouth every morning.      Multiple Vitamin (MULTIVITAMIN) tablet Take 1 tablet by mouth daily.     Omega-3 Fatty Acids (FISH OIL PO) Take 1 capsule by mouth daily.      sildenafil (VIAGRA) 100 MG tablet Take 1 tablet by mouth daily as needed.     No current facility-administered medications for this visit.    Allergies:   Lisinopril and Simvastatin   Social History:  The patient  reports that he has never smoked. He has never used smokeless tobacco. He reports current alcohol use of about 14.0 standard drinks of alcohol per week. He reports that he does not use drugs.   Family History:  The patient's family history includes Heart disease in his father.   ROS:  Please see the history of present illness.   Otherwise, review of systems is positive for none.   All other systems are reviewed and negative.   PHYSICAL EXAM: VS:  BP 134/78   Pulse 68   Ht '6\' 2"'$  (1.88 m)   Wt 203 lb (92.1 kg)   SpO2 97%   BMI 26.06 kg/m  , BMI Body mass index is 26.06 kg/m. GEN: Well nourished, well developed, in no acute distress  HEENT: normal  Neck: no JVD, carotid bruits, or masses Cardiac: RRR; no murmurs, rubs, or gallops,no edema  Respiratory:  clear to auscultation bilaterally, normal work of breathing GI: soft, nontender, nondistended, + BS MS: no deformity or atrophy  Skin: warm and dry Neuro:  Strength and sensation are intact Psych: euthymic mood, full affect  EKG:  EKG is ordered today. Personal review of the ekg ordered shows sinus rhythm    Recent Labs: No results found for requested labs within last 365 days.    Lipid Panel  No results found for: "CHOL", "TRIG", "HDL", "CHOLHDL", "VLDL", "LDLCALC", "LDLDIRECT"   Wt Readings from Last 3 Encounters:  12/31/21 203 lb (92.1 kg)  11/20/21 212 lb (96.2 kg)  10/24/21 208 lb (94.3 kg)      Other studies Reviewed: Additional studies/ records that were reviewed today include: Cardiac monitor 11/10/2021  personally reviewed Review of the above records today demonstrates:  Predominant rhythm was sinus rhythm 25.1% ventricular ectopy <1% supraventricular ectopy Lightheaded associated with both sinus rhythm and sinus rhythm with ventricular bigeminy   TTE 11/21/2018.  1. Left ventricular ejection fraction, by estimation, is 60 to 65%. The  left ventricle has normal function. The left ventricle has no regional  wall motion abnormalities. There is mild left ventricular hypertrophy.  Left ventricular diastolic parameters  are consistent with Grade I diastolic dysfunction (impaired relaxation).  The average left ventricular global longitudinal strain is -17.6 %.   2. Right ventricular systolic function is normal. The right ventricular  size is moderately enlarged.   3. Left atrial size was severely dilated.   4. Right atrial size was mildly dilated.   5. PISA radius 0.5 cm. The mitral valve is normal in structure. Mild to  moderate mitral valve regurgitation. No evidence of mitral stenosis.   6. The aortic valve is calcified. There is moderate calcification of the  aortic valve. There is moderate thickening of the aortic valve. Aortic  valve regurgitation is mild. Mild aortic valve stenosis. Aortic valve  area, by VTI measures 1.80 cm. Aortic  valve mean gradient measures 12.0 mmHg. Aortic valve Vmax measures 2.37  m/s.   7. Aortic dilatation noted. There is mild dilatation of the ascending  aorta, measuring 40 mm.   8. The inferior vena cava is normal in size with greater than 50%  respiratory variability, suggesting right atrial pressure of 3 mmHg.   ASSESSMENT AND PLAN:  1.  Sinus bradycardia: Minimum heart rate on most recent cardiac monitor was 44.  These were all nocturnal episodes.  We Tariah Transue continue with current management.  2.  PVCs: Wore a cardiac monitor with a burden of greater than 25%.  Currently on flecainide 50 mg twice daily.  High risk medication monitoring for  flecainide.  ECG with QRS that remains narrow.  He is happy with his control.  We Mairely Foxworth continue with current management.  Current medicines are reviewed at length with the patient today.   The patient does not have concerns regarding his medicines.  The following changes were made today: None  Labs/ tests ordered today include:  Orders Placed This Encounter  Procedures   EKG 12-Lead      Disposition:   FU 6 months  Signed, Halston Kintz Meredith Leeds, MD  12/31/2021 2:59 PM     [facility] [address] [place] [address] [phone] (office) [phone] (fax)

## 2022-02-13 DIAGNOSIS — M25562 Pain in left knee: Secondary | ICD-10-CM | POA: Diagnosis not present

## 2022-02-13 DIAGNOSIS — M1712 Unilateral primary osteoarthritis, left knee: Secondary | ICD-10-CM | POA: Diagnosis not present

## 2022-03-11 DIAGNOSIS — M25562 Pain in left knee: Secondary | ICD-10-CM | POA: Diagnosis not present

## 2022-03-11 DIAGNOSIS — M1712 Unilateral primary osteoarthritis, left knee: Secondary | ICD-10-CM | POA: Diagnosis not present

## 2022-04-01 DIAGNOSIS — M1611 Unilateral primary osteoarthritis, right hip: Secondary | ICD-10-CM | POA: Diagnosis not present

## 2022-04-01 DIAGNOSIS — M1612 Unilateral primary osteoarthritis, left hip: Secondary | ICD-10-CM | POA: Diagnosis not present

## 2022-04-10 DIAGNOSIS — M25859 Other specified joint disorders, unspecified hip: Secondary | ICD-10-CM | POA: Diagnosis not present

## 2022-04-10 DIAGNOSIS — M25551 Pain in right hip: Secondary | ICD-10-CM | POA: Diagnosis not present

## 2022-04-10 DIAGNOSIS — M160 Bilateral primary osteoarthritis of hip: Secondary | ICD-10-CM | POA: Diagnosis not present

## 2022-04-10 DIAGNOSIS — M25552 Pain in left hip: Secondary | ICD-10-CM | POA: Diagnosis not present

## 2022-05-27 ENCOUNTER — Telehealth: Payer: Self-pay | Admitting: *Deleted

## 2022-05-27 NOTE — Telephone Encounter (Signed)
   Pre-operative Risk Assessment    Patient Name: Stephen Roy  DOB: [birth year] MRN: [id number]      Request for Surgical Clearance    Procedure:   LEFT TOTAL KNEE ARTHROPLASTY  Date of Surgery:  Clearance 07/29/22                                 Surgeon:  DR. MATTHEW OLIN Surgeon's Group or Practice Name:  [facility] Phone number:  [phone] ATTN: Rosalva Ferron Fax number:  [phone]   Type of Clearance Requested:   - Medical ; ASA    Type of Anesthesia:  Spinal   Additional requests/questions:    [name]   05/27/2022, 12:10 PM

## 2022-05-27 NOTE — Telephone Encounter (Signed)
   Name: Stephen Roy  DOB: October 06, 1945  MRN: [id number]  Primary Cardiologist: None  Chart reviewed as part of pre-operative protocol coverage. Because of Blade S Christenbury's past medical history and time since last visit, he will require a follow-up telephone visit in order to better assess preoperative cardiovascular risk.  Pre-op covering staff: - Please schedule appointment and call patient to inform them. If patient already had an upcoming appointment within acceptable timeframe, please add "pre-op clearance" to the appointment notes so provider is aware. - Please contact requesting surgeon's office via preferred method (i.e, phone, fax) to inform them of need for appointment prior to surgery.  ASA not prescribed by [place]. Would recommend contacting PCP or prescribing MD for holding parameters.   Sharlene Dory, PA-C  05/27/2022, 1:17 PM

## 2022-05-27 NOTE — Telephone Encounter (Signed)
Pt see's EP Dr. Elberta Fortis. Left message to call back to schedule tele pre op appt.

## 2022-05-27 NOTE — Telephone Encounter (Signed)
Pt has appt 06/03/22 with Francis Dowse, PAC for pre op clearance. I will update all parties involved.

## 2022-05-28 DIAGNOSIS — M1712 Unilateral primary osteoarthritis, left knee: Secondary | ICD-10-CM | POA: Diagnosis not present

## 2022-05-30 NOTE — Telephone Encounter (Signed)
SURGEON OFFICE SENT OVER AN UPDATE ON SURGERY CLEARANCE; SURGERY DATE HAS BEEN MOVED UP TO 06/24/22. I WILL UPDATE ALL PARTIES INVOLVED

## 2022-06-01 NOTE — Progress Notes (Signed)
Cardiology Office Note Date:  06/03/2022  Patient ID:  Stephen Roy, Stephen Roy Nov 12, 1945, MRN [id number] PCP:  Alysia Penna, MD  Electrophysiologist: Dr. Elberta Fortis    Chief Complaint:  6 mo, pre-op  History of Present Illness: Stephen Roy is a 77 y.o. male with history of PVCs, sinus bradycardia, HTN, HLD  July 2019 he had an episode of weakness and fatigue.  This was associated with near syncope.  EMS was called and he had sinus rhythm in the 40s.  He more recently Oct 2023 wore a cardiac monitor that showed a 25% PVC burden.  He had an echo that showed a normal ejection fraction.  He was started on flecainide.  Discussed bradycardia slowest rates mostly noted nocturnally  He saw Dr. Elberta Fortis 12/31/21, feeling better, less fatigued, ADLs were easier  Pending LEFT TOTAL KNEE ARTHROPLASTY   RCRI score is zero, 0.4%   TODAY Outside of his knee, he feels quite well, in the winters he lives in [place], and skiis regularly with good exertional capacity Summers back here, golfs regularly, stays active, walks his dog several times a day, no exertional intolerances No CP, palpitations or cardiac awareness. No SOB No dizzy spells, near syncope or syncope.   AAD hx Flecainide started Oct 2023   Past Medical History:  Diagnosis Date   Abnormal EKG    Bradycardia    Dizziness    Hyperlipidemia    Hypertension     Past Surgical History:  Procedure Laterality Date   CLOSED REDUCTION HAND FRACTURE Right 1961   COLONOSCOPY  10/2015   jacobs hx polyps   FRACTURE SURGERY Left 2001   shoulder   TONSILLECTOMY  10/1949    Current Outpatient Medications  Medication Sig Dispense Refill   aspirin 81 MG chewable tablet Chew 81 mg by mouth daily.      atorvastatin (LIPITOR) 10 MG tablet Take 10 mg by mouth daily.     flecainide (TAMBOCOR) 50 MG tablet Take 1 tablet (50 mg total) by mouth 2 (two) times daily. 180 tablet 3   hydrochlorothiazide (HYDRODIURIL) 12.5 MG tablet Take 12.5 mg  by mouth daily.     irbesartan (AVAPRO) 150 MG tablet Take 150 mg by mouth every morning.      Multiple Vitamin (MULTIVITAMIN) tablet Take 1 tablet by mouth daily.     Omega-3 Fatty Acids (FISH OIL PO) Take 1 capsule by mouth daily.      sildenafil (VIAGRA) 100 MG tablet Take 1 tablet by mouth daily as needed.     No current facility-administered medications for this visit.    Allergies:   Lisinopril and Simvastatin   Social History:  The patient  reports that he has never smoked. He has never used smokeless tobacco. He reports current alcohol use of about 14.0 standard drinks of alcohol per week. He reports that he does not use drugs.   Family History:  The patient's family history includes Heart disease in his father.  ROS:  Please see the history of present illness.    All other systems are reviewed and otherwise negative.   PHYSICAL EXAM:  VS:  BP (!) 152/86   Pulse 63   Ht 6\' 2"  (1.88 m)   Wt 205 lb (93 kg)   SpO2 99%   BMI 26.32 kg/m  BMI: Body mass index is 26.32 kg/m. Well nourished, well developed, in no acute distress HEENT: normocephalic, atraumatic Neck: no JVD, carotid bruits or masses Cardiac:  RRR; no  significant murmurs, no rubs, or gallops Lungs:  CTA b/l, no wheezing, rhonchi or rales Abd: soft, nontender MS: no deformity or atrophy Ext: no edema Skin: warm and dry, no rash Neuro:  No gross deficits appreciated Psych: euthymic mood, full affect    EKG:  Done today and reviewed by myself shows  SR 63bpm, , PR (baseline pre-flecainide 206), QRS 90ms QTc   12/31/21: TTE 1. Left ventricular ejection fraction, by estimation, is 60 to 65%. The  left ventricle has normal function. The left ventricle has no regional  wall motion abnormalities. There is mild left ventricular hypertrophy.  Left ventricular diastolic parameters  are consistent with Grade I diastolic dysfunction (impaired relaxation).  The average left ventricular global  longitudinal strain is -17.6 %.   2. Right ventricular systolic function is normal. The right ventricular  size is moderately enlarged.   3. Left atrial size was severely dilated.   4. Right atrial size was mildly dilated.   5. PISA radius 0.5 cm. The mitral valve is normal in structure. Mild to  moderate mitral valve regurgitation. No evidence of mitral stenosis.   6. The aortic valve is calcified. There is moderate calcification of the  aortic valve. There is moderate thickening of the aortic valve. Aortic  valve regurgitation is mild. Mild aortic valve stenosis. Aortic valve  area, by VTI measures 1.80 cm. Aortic  valve mean gradient measures 12.0 mmHg. Aortic valve Vmax measures 2.37  m/s.   7. Aortic dilatation noted. There is mild dilatation of the ascending  aorta, measuring 40 mm.   8. The inferior vena cava is normal in size with greater than 50%  respiratory variability, suggesting right atrial pressure of 3 mmHg.    Cardiac monitor 11/10/2021  Review of the above records today demonstrates:  Predominant rhythm was sinus rhythm 25.1% ventricular ectopy <1% supraventricular ectopy Lightheaded associated with both sinus rhythm and sinus rhythm with ventricular bigeminy   TTE 11/21/2018.  1. Left ventricular ejection fraction, by estimation, is 60 to 65%. The  left ventricle has normal function. The left ventricle has no regional  wall motion abnormalities. There is mild left ventricular hypertrophy.  Left ventricular diastolic parameters  are consistent with Grade I diastolic dysfunction (impaired relaxation).  The average left ventricular global longitudinal strain is -17.6 %.   2. Right ventricular systolic function is normal. The right ventricular  size is moderately enlarged.   3. Left atrial size was severely dilated.   4. Right atrial size was mildly dilated.   5. PISA radius 0.5 cm. The mitral valve is normal in structure. Mild to  moderate mitral valve  regurgitation. No evidence of mitral stenosis.   6. The aortic valve is calcified. There is moderate calcification of the  aortic valve. There is moderate thickening of the aortic valve. Aortic  valve regurgitation is mild. Mild aortic valve stenosis. Aortic valve  area, by VTI measures 1.80 cm. Aortic  valve mean gradient measures 12.0 mmHg. Aortic valve Vmax measures 2.37  m/s.   7. Aortic dilatation noted. There is mild dilatation of the ascending  aorta, measuring 40 mm.   8. The inferior vena cava is normal in size with greater than 50%  respiratory variability, suggesting right atrial pressure of 3 mmHg.     Recent Labs: No results found for requested labs within last 365 days.  No results found for requested labs within last 365 days.   CrCl cannot be calculated (Patient's most recent lab  result is older than the maximum 21 days allowed.).   Wt Readings from Last 3 Encounters:  06/03/22 205 lb (93 kg)  12/31/21 203 lb (92.1 kg)  11/20/21 212 lb (96.2 kg)     Other studies reviewed: Additional studies/records reviewed today include: summarized above  ASSESSMENT AND PLAN:  Sinus bradycardia asymptomatic  PVCs On flecainide stable intervals No symptoms Plan a flecainide monitoring/management EST after he is recovered from his knee surgery  HTN A recheck is 140/80, he monitors at home 120's/80s mostly, some higher, though not regularly  Pre-op Low cardiac risk surgery Low cardiac risk score No cardiac contraindication to knee surgery    Disposition: F/u with [place] in 5mo, sooner if needed  Current medicines are reviewed at length with the patient today.  The patient did not have any concerns regarding medicines.  Norma Fredrickson, PA-C 06/03/2022 4:31 PM     [facility] [address] [place] [address] [phone] (office)  [phone] (fax)

## 2022-06-03 ENCOUNTER — Encounter: Payer: Self-pay | Admitting: Physician Assistant

## 2022-06-03 ENCOUNTER — Ambulatory Visit: Payer: Medicare Other | Attending: Physician Assistant | Admitting: Physician Assistant

## 2022-06-03 VITALS — BP 152/86 | HR 63 | Ht 74.0 in | Wt 205.0 lb

## 2022-06-03 DIAGNOSIS — Z5181 Encounter for therapeutic drug level monitoring: Secondary | ICD-10-CM | POA: Insufficient documentation

## 2022-06-03 DIAGNOSIS — Z01818 Encounter for other preprocedural examination: Secondary | ICD-10-CM | POA: Insufficient documentation

## 2022-06-03 DIAGNOSIS — Z79899 Other long term (current) drug therapy: Secondary | ICD-10-CM | POA: Insufficient documentation

## 2022-06-03 DIAGNOSIS — I10 Essential (primary) hypertension: Secondary | ICD-10-CM | POA: Insufficient documentation

## 2022-06-03 DIAGNOSIS — R001 Bradycardia, unspecified: Secondary | ICD-10-CM | POA: Diagnosis not present

## 2022-06-03 DIAGNOSIS — I493 Ventricular premature depolarization: Secondary | ICD-10-CM | POA: Insufficient documentation

## 2022-06-03 NOTE — Patient Instructions (Signed)
Medication Instructions:   Your physician recommends that you continue on your current medications as directed. Please refer to the Current Medication list given to you today.   *If you need a refill on your cardiac medications before your next appointment, please call your pharmacy*   Lab Work:  NONE ORDERED  TODAY   If you have labs (blood work) drawn today and your tests are completely normal, you will receive your results only by: MyChart Message (if you have MyChart) OR A paper copy in the mail If you have any lab test that is abnormal or we need to change your treatment, we will call you to review the results.   Testing/Procedures: NONE ORDERED  TODAY     Follow-Up: At [facility], you and your health needs are our priority.  As part of our continuing mission to provide you with exceptional heart care, we have created designated Provider Care Teams.  These Care Teams include your primary Cardiologist (physician) and Advanced Practice Providers (APPs -  Physician Assistants and Nurse Practitioners) who all work together to provide you with the care you need, when you need it.  We recommend signing up for the patient portal called "MyChart".  Sign up information is provided on this After Visit Summary.  MyChart is used to connect with patients for Virtual Visits (Telemedicine).  Patients are able to view lab/test results, encounter notes, upcoming appointments, etc.  Non-urgent messages can be sent to your provider as well.   To learn more about what you can do with MyChart, go to [email or web address].    Your next appointment:   3 month(s)  Provider:   Loman Brooklyn, MD or Francis Dowse, PA-C    Other Instructions

## 2022-06-04 NOTE — Patient Instructions (Addendum)
SURGICAL WAITING ROOM VISITATION  Patients having surgery or a procedure may have no more than 2 support people in the waiting area - these visitors may rotate.    Children under the age of 34 must have an adult with them who is not the patient.  Due to an increase in RSV and influenza rates and associated hospitalizations, children ages 28 and under may not visit patients in [facility] hospitals.  If the patient needs to stay at the hospital during part of their recovery, the visitor guidelines for inpatient rooms apply. Pre-op nurse will coordinate an appropriate time for 1 support person to accompany patient in pre-op.  This support person may not rotate.    Please refer to the [facility] website for the visitor guidelines for Inpatients (after your surgery is over and you are in a regular room).       Your procedure is scheduled on: 06/24/22   Report to [facility] Main Entrance    Report to admitting at 10:25 AM   Call this number if you have problems the morning of surgery [phone]   Do not eat food :After Midnight.   After Midnight you may have the following liquids until 9:55  AM DAY OF SURGERY  Water Non-Citrus Juices (without pulp, NO RED-Apple, White grape, White cranberry) Black Coffee (NO MILK/CREAM OR CREAMERS, sugar ok)  Clear Tea (NO MILK/CREAM OR CREAMERS, sugar ok) regular and decaf                             Plain Jell-O (NO RED)                                           Fruit ices (not with fruit pulp, NO RED)                                     Popsicles (NO RED)                                                               Sports drinks like Gatorade (NO RED)                     Drink ONE (1) Pre-Surgery Clear Ensure  at 9:50 AM the morning of surgery. Drink in one sitting. Do not sip.  This drink was given to you during your hospital  pre-op appointment visit. Nothing else to drink after completing the Pre-Surgery Clear Ensure           If you have questions, please contact your surgeon's office.   FOLLOW  ANY ADDITIONAL PRE OP INSTRUCTIONS YOU RECEIVED FROM YOUR SURGEON'S OFFICE!!!     Oral Hygiene is also important to reduce your risk of infection.                                    Remember - BRUSH YOUR TEETH THE MORNING OF SURGERY WITH YOUR REGULAR TOOTHPASTE  DENTURES  WILL BE REMOVED PRIOR TO SURGERY PLEASE DO NOT APPLY "Poly grip" OR ADHESIVES!!!   Do NOT smoke after Midnight   Take these medicines the morning of surgery with A SIP OF WATER:   Atorvastatin  Flecainide  DO NOT TAKE ANY ORAL DIABETIC MEDICATIONS DAY OF YOUR SURGERY  Bring CPAP mask and tubing day of surgery.                              You may not have any metal on your body including  jewelry, and body piercing             Do not wear lotions, powders, cologne, or deodorant              Men may shave face and neck.   Do not bring valuables to the hospital. [facility] IS NOT RESPONSIBLE   FOR VALUABLES.   Contacts, glasses, dentures or bridgework may not be worn into surgery.   Bring small overnight bag day of surgery.   DO NOT BRING YOUR HOME MEDICATIONS TO THE HOSPITAL. PHARMACY WILL DISPENSE MEDICATIONS LISTED ON YOUR MEDICATION LIST TO YOU DURING YOUR ADMISSION IN THE HOSPITAL!   Special Instructions: Bring a copy of your healthcare power of attorney and living will documents the day of surgery if you haven't scanned them before.              Please read over the following fact sheets you were given: IF YOU HAVE QUESTIONS ABOUT YOUR PRE-OP INSTRUCTIONS PLEASE CALL [phone] [facility]   If you received a COVID test during your pre-op visit  it is requested that you wear a mask when out in public, stay away from anyone that may not be feeling well and notify your surgeon if you develop symptoms. If you test positive for Covid or have been in contact with anyone that has tested positive in the last 10 days please notify you  surgeon.    Incentive Spirometer  An incentive spirometer is a tool that can help keep your lungs clear and active. This tool measures how well you are filling your lungs with each breath. Taking long deep breaths may help reverse or decrease the chance of developing breathing (pulmonary) problems (especially infection) following: A long period of time when you are unable to move or be active. BEFORE THE PROCEDURE  If the spirometer includes an indicator to show your best effort, your nurse or respiratory therapist will set it to a desired goal. If possible, sit up straight or lean slightly forward. Try not to slouch. Hold the incentive spirometer in an upright position. INSTRUCTIONS FOR USE  Sit on the edge of your bed if possible, or sit up as far as you can in bed or on a chair. Hold the incentive spirometer in an upright position. Breathe out normally. Place the mouthpiece in your mouth and seal your lips tightly around it. Breathe in slowly and as deeply as possible, raising the piston or the ball toward the top of the column. Hold your breath for 3-5 seconds or for as long as possible. Allow the piston or ball to fall to the bottom of the column. Remove the mouthpiece from your mouth and breathe out normally. Rest for a few seconds and repeat Steps 1 through 7 at least 10 times every 1-2 hours when you are awake. Take your time and take a few normal breaths between deep breaths. The  spirometer may include an indicator to show your best effort. Use the indicator as a goal to work toward during each repetition. After each set of 10 deep breaths, practice coughing to be sure your lungs are clear. If you have an incision (the cut made at the time of surgery), support your incision when coughing by placing a pillow or rolled up towels firmly against it. Once you are able to get out of bed, walk around indoors and cough well. You may stop using the incentive spirometer when instructed by your  caregiver.  RISKS AND COMPLICATIONS Take your time so you do not get dizzy or light-headed. If you are in pain, you may need to take or ask for pain medication before doing incentive spirometry. It is harder to take a deep breath if you are having pain. AFTER USE Rest and breathe slowly and easily. It can be helpful to keep track of a log of your progress. Your caregiver can provide you with a simple table to help with this. If you are using the spirometer at home, follow these instructions: SEEK MEDICAL CARE IF:  You are having difficultly using the spirometer. You have trouble using the spirometer as often as instructed. Your pain medication is not giving enough relief while using the spirometer. You develop fever of 100.5 F (38.1 C) or higher. SEEK IMMEDIATE MEDICAL CARE IF:  You cough up bloody sputum that had not been present before. You develop fever of 102 F (38.9 C) or greater. You develop worsening pain at or near the incision site. MAKE SURE YOU:  Understand these instructions. Will watch your condition. Will get help right away if you are not doing well or get worse. Document Released: 06/09/2006 Document Revised: 04/21/2011 Document Reviewed: 08/10/2006 [facility] Patient Information 2014 [place], [place].   ________________________________________________________________________

## 2022-06-06 NOTE — Progress Notes (Signed)
COVID Vaccine Completed:  Date of COVID positive in last 90 days:  PCP - Alysia Penna, MD (office note on chart) Cardiologist - Loman Brooklyn, MD  Cardiac clearance in Epic dated 06-03-22 by Francis Dowse, PA-C  Medical clearance on chart dated 05-30-22  Chest x-ray -  EKG - 06-03-22 Epic Stress Test -  ECHO - 11-20-21 Epic Cardiac Cath -  Pacemaker/ICD device last checked: Spinal Cord Stimulator: Long Term Monitor - 11-08-21 Epic  Bowel Prep -   Sleep Study -  CPAP -   Fasting Blood Sugar -  Checks Blood Sugar _____ times a day  Last dose of GLP1 agonist-  N/A GLP1 instructions:  N/A   Last dose of SGLT-2 inhibitors-  N/A SGLT-2 instructions: N/A   Blood Thinner Instructions:  Time Aspirin Instructions: Last Dose:  Activity level:  Can go up a flight of stairs and perform activities of daily living without stopping and without symptoms of chest pain or shortness of breath.  Able to exercise without symptoms  Unable to go up a flight of stairs without symptoms of     Anesthesia review:  Bradycardia followed by cardiology, HTN, PVCs  Patient denies shortness of breath, fever, cough and chest pain at PAT appointment  Patient verbalized understanding of instructions that were given to them at the PAT appointment. Patient was also instructed that they will need to review over the PAT instructions again at home before surgery.

## 2022-06-12 ENCOUNTER — Other Ambulatory Visit: Payer: Self-pay

## 2022-06-12 ENCOUNTER — Encounter (HOSPITAL_COMMUNITY)
Admission: RE | Admit: 2022-06-12 | Discharge: 2022-06-12 | Disposition: A | Discharge status: Home / Self Care | Payer: Medicare Other | Source: Ambulatory Visit | Attending: Orthopedic Surgery | Admitting: Orthopedic Surgery

## 2022-06-12 ENCOUNTER — Encounter (HOSPITAL_COMMUNITY): Payer: Self-pay

## 2022-06-12 VITALS — BP 137/83 | HR 65 | Temp 98.3°F | Resp 20 | Ht 73.0 in | Wt 200.2 lb

## 2022-06-12 DIAGNOSIS — Z01812 Encounter for preprocedural laboratory examination: Secondary | ICD-10-CM | POA: Diagnosis not present

## 2022-06-12 DIAGNOSIS — I10 Essential (primary) hypertension: Secondary | ICD-10-CM | POA: Insufficient documentation

## 2022-06-12 DIAGNOSIS — I350 Nonrheumatic aortic (valve) stenosis: Secondary | ICD-10-CM | POA: Insufficient documentation

## 2022-06-12 DIAGNOSIS — M1712 Unilateral primary osteoarthritis, left knee: Secondary | ICD-10-CM | POA: Insufficient documentation

## 2022-06-12 DIAGNOSIS — Z01818 Encounter for other preprocedural examination: Secondary | ICD-10-CM

## 2022-06-12 DIAGNOSIS — I493 Ventricular premature depolarization: Secondary | ICD-10-CM | POA: Diagnosis not present

## 2022-06-12 HISTORY — DX: Gastro-esophageal reflux disease without esophagitis: K21.9

## 2022-06-12 HISTORY — DX: Unspecified osteoarthritis, unspecified site: M19.90

## 2022-06-12 LAB — CBC
HCT: 37.6 % — ABNORMAL LOW (ref 39.0–52.0)
Hemoglobin: 13 g/dL (ref 13.0–17.0)
MCH: 32.3 pg (ref 26.0–34.0)
MCHC: 34.6 g/dL (ref 30.0–36.0)
MCV: 93.5 fL (ref 80.0–100.0)
Platelets: 176 10*3/uL (ref 150–400)
RBC: 4.02 MIL/uL — ABNORMAL LOW (ref 4.22–5.81)
RDW: 13.1 % (ref 11.5–15.5)
WBC: 5.7 10*3/uL (ref 4.0–10.5)
nRBC: 0 % (ref 0.0–0.2)

## 2022-06-12 LAB — BASIC METABOLIC PANEL
Anion gap: 9 (ref 5–15)
BUN: 28 mg/dL — ABNORMAL HIGH (ref 8–23)
CO2: 23 mmol/L (ref 22–32)
Calcium: 9.2 mg/dL (ref 8.9–10.3)
Chloride: 104 mmol/L (ref 98–111)
Creatinine, Ser: 1.07 mg/dL (ref 0.61–1.24)
GFR, Estimated: >60 mL/min (ref >60)
Glucose, Bld: 94 mg/dL (ref 70–99)
Potassium: 4.2 mmol/L (ref 3.5–5.1)
Sodium: 136 mmol/L (ref 135–145)

## 2022-06-12 LAB — SURGICAL PCR SCREEN
MRSA, PCR: NEGATIVE
Staphylococcus aureus: NEGATIVE

## 2022-06-16 NOTE — Progress Notes (Signed)
Anesthesia Chart Review   Case: [id number] Date/Time: 06/24/22 1240   Procedure: TOTAL KNEE ARTHROPLASTY (Left: Knee)   Anesthesia type: Spinal   Pre-op diagnosis: Left knee osteoarthritis   Location: [facility] ROOM 09 / [facility]   Surgeons: Durene Romans, MD       DISCUSSION:77 y.o. never smoker with h/o HTN, asymptomatic bradycardia, PVCs, mild AV stenosis valve area 1.80 cm2, mean gradient 12.0 mmHg on Echo 11/20/2021, left knee OA scheduled for above procedure 06/24/2022 with Dr. Durene Romans.   Pt last seen by cardiology 06/03/2022. Per OV note, "Low cardiac risk surgery Low cardiac risk score No cardiac contraindication to knee surgery"  Anticipate pt can proceed with planned procedure barring acute status change.   VS: BP 137/83   Pulse 65   Temp 36.8 C (Oral)   Resp 20   Ht 6\' 1"  (1.854 m)   Wt 90.8 kg   SpO2 97%   BMI 26.41 kg/m   PROVIDERS: Alysia Penna, MD is PCP    LABS: Labs reviewed: Acceptable for surgery. (all labs ordered are listed, but only abnormal results are displayed)  Labs Reviewed  CBC - Abnormal; Notable for the following components:      Result Value   RBC 4.02 (*)    HCT 37.6 (*)    All other components within normal limits  BASIC METABOLIC PANEL - Abnormal; Notable for the following components:   BUN 28 (*)    All other components within normal limits  SURGICAL PCR SCREEN     IMAGES:   EKG:   CV: Echo 11/20/2021 1. Left ventricular ejection fraction, by estimation, is 60 to 65%. The  left ventricle has normal function. The left ventricle has no regional  wall motion abnormalities. There is mild left ventricular hypertrophy.  Left ventricular diastolic parameters  are consistent with Grade I diastolic dysfunction (impaired relaxation).  The average left ventricular global longitudinal strain is -17.6 %.   2. Right ventricular systolic function is normal. The right ventricular  size is moderately enlarged.   3. Left atrial size was  severely dilated.   4. Right atrial size was mildly dilated.   5. PISA radius 0.5 cm. The mitral valve is normal in structure. Mild to  moderate mitral valve regurgitation. No evidence of mitral stenosis.   6. The aortic valve is calcified. There is moderate calcification of the  aortic valve. There is moderate thickening of the aortic valve. Aortic  valve regurgitation is mild. Mild aortic valve stenosis. Aortic valve  area, by VTI measures 1.80 cm. Aortic  valve mean gradient measures 12.0 mmHg. Aortic valve Vmax measures 2.37  m/s.   7. Aortic dilatation noted. There is mild dilatation of the ascending  aorta, measuring 40 mm.   8. The inferior vena cava is normal in size with greater than 50%  respiratory variability, suggesting right atrial pressure of 3 mmHg.  Past Medical History:  Diagnosis Date   Abnormal EKG    Arthritis    Bradycardia    Dizziness    GERD (gastroesophageal reflux disease)    Hyperlipidemia    Hypertension     Past Surgical History:  Procedure Laterality Date   CLOSED REDUCTION HAND FRACTURE Right 1961   COLONOSCOPY  10/2015   jacobs hx polyps   FRACTURE SURGERY Left 2001   shoulder   TONSILLECTOMY  10/1949    MEDICATIONS:  aspirin 81 MG chewable tablet   atorvastatin (LIPITOR) 10 MG tablet   flecainide (TAMBOCOR) 50  MG tablet   hydrochlorothiazide (HYDRODIURIL) 12.5 MG tablet   irbesartan (AVAPRO) 150 MG tablet   Multiple Vitamin (MULTIVITAMIN) tablet   Omega-3 Fatty Acids (FISH OIL PO)   sildenafil (VIAGRA) 100 MG tablet   No current facility-administered medications for this encounter.    Jodell Cipro Ward, PA-C [facility] Pre-Surgical Testing [phone]

## 2022-06-16 NOTE — Anesthesia Preprocedure Evaluation (Addendum)
Anesthesia Evaluation  Patient identified by MRN, date of birth, ID band Patient awake    Reviewed: Allergy & Precautions, NPO status , Patient's Chart, lab work & pertinent test results  History of Anesthesia Complications Negative for: history of anesthetic complications  Airway Mallampati: II  TM Distance: >3 FB Neck ROM: Full    Dental  (+) Teeth Intact, Dental Advisory Given   Pulmonary neg pulmonary ROS   breath sounds clear to auscultation       Cardiovascular hypertension, Pt. on medications (-) angina (-) Past MI and (-) CHF  Rhythm:Regular     Neuro/Psych negative neurological ROS  negative psych ROS   GI/Hepatic Neg liver ROS,GERD  ,,  Endo/Other  negative endocrine ROS    Renal/GU negative Renal ROS     Musculoskeletal  (+) Arthritis ,    Abdominal   Peds  Hematology Lab Results      Component                Value               Date                      WBC                      5.7                 06/12/2022                HGB                      13.0                06/12/2022                HCT                      37.6 (L)            06/12/2022                MCV                      93.5                06/12/2022                PLT                      176                 06/12/2022              Anesthesia Other Findings   Reproductive/Obstetrics                             Anesthesia Physical Anesthesia Plan  ASA: 2  Anesthesia Plan: MAC, Regional and Spinal   Post-op Pain Management:    Induction: Intravenous  PONV Risk Score and Plan: 2 and Propofol infusion and Treatment may vary due to age or medical condition  Airway Management Planned: Nasal Cannula, Natural Airway and Simple Face Mask  Additional Equipment: None  Intra-op Plan:   Post-operative Plan:   Informed Consent: I have reviewed the patients History and Physical, chart, labs and discussed  the procedure including the  risks, benefits and alternatives for the proposed anesthesia with the patient or authorized representative who has indicated his/her understanding and acceptance.     Dental advisory given  Plan Discussed with: CRNA  Anesthesia Plan Comments: (See PAT note 06/12/22)       Anesthesia Quick Evaluation

## 2022-06-23 NOTE — H&P (Signed)
TOTAL KNEE ADMISSION H&P  Patient is being admitted for left total knee arthroplasty.  Subjective:  Chief Complaint:left knee pain.  HPI: Stephen Roy, 77 y.o. male, has a history of pain and functional disability in the left knee due to arthritis and has failed non-surgical conservative treatments for greater than 12 weeks to includeNSAID's and/or analgesics, corticosteriod injections, and activity modification.  Onset of symptoms was gradual, starting 2 years ago with gradually worsening course since that time. The patient noted prior procedures on the knee to include  arthroscopy and menisectomy on the left knee(s).  Patient currently rates pain in the left knee(s) at 8 out of 10 with activity. Patient has worsening of pain with activity and weight bearing and pain that interferes with activities of daily living.  Patient has evidence of joint space narrowing by imaging studies.  There is no active infection.  Patient Active Problem List   Diagnosis Date Noted   Hypertension    Hyperlipidemia    Dizziness    Bradycardia    Abnormal EKG    Past Medical History:  Diagnosis Date   Abnormal EKG    Arthritis    Bradycardia    Dizziness    GERD (gastroesophageal reflux disease)    Hyperlipidemia    Hypertension     Past Surgical History:  Procedure Laterality Date   CLOSED REDUCTION HAND FRACTURE Right 1961   COLONOSCOPY  10/2015   jacobs hx polyps   FRACTURE SURGERY Left 2001   shoulder   TONSILLECTOMY  10/1949    No current facility-administered medications for this encounter.   Current Outpatient Medications  Medication Sig Dispense Refill Last Dose   aspirin 81 MG chewable tablet Chew 81 mg by mouth daily.       atorvastatin (LIPITOR) 10 MG tablet Take 10 mg by mouth daily.      flecainide (TAMBOCOR) 50 MG tablet Take 1 tablet (50 mg total) by mouth 2 (two) times daily. 180 tablet 3    hydrochlorothiazide (HYDRODIURIL) 12.5 MG tablet Take 12.5 mg by mouth daily.       irbesartan (AVAPRO) 150 MG tablet Take 150 mg by mouth every morning.       Multiple Vitamin (MULTIVITAMIN) tablet Take 1 tablet by mouth daily.      Omega-3 Fatty Acids (FISH OIL PO) Take 1 capsule by mouth daily.       sildenafil (VIAGRA) 100 MG tablet Take 100 mg by mouth daily as needed for erectile dysfunction.      Allergies  Allergen Reactions   Lisinopril     Other reaction(s): impotence   Simvastatin     Other reaction(s): myalgia    Social History   Tobacco Use   Smoking status: Never   Smokeless tobacco: Never  Substance Use Topics   Alcohol use: Yes    Alcohol/week: 14.0 standard drinks of alcohol    Types: 14 Standard drinks or equivalent per week    Family History  Problem Relation Age of Onset   Heart disease Father    Colon cancer Neg Hx    Rectal cancer Neg Hx    Stomach cancer Neg Hx    Esophageal cancer Neg Hx      Review of Systems  Constitutional:  Negative for chills and fever.  Respiratory:  Negative for cough and shortness of breath.   Cardiovascular:  Negative for chest pain.  Gastrointestinal:  Negative for nausea and vomiting.  Musculoskeletal:  Positive for arthralgias.  Objective:  Physical Exam Constitutional:      Appearance: Normal appearance.  HENT:     Head: Normocephalic and atraumatic.  Neurological:     Mental Status: He is alert.   Left Knee: No erythema, warmth, or effusion AROM 0-120 Mild joint line tenderness   Vital signs in last 24 hours:    Labs:   Estimated body mass index is 26.41 kg/m as calculated from the following:   Height as of 06/12/22: 6\' 1"  (1.854 m).   Weight as of 06/12/22: 90.8 kg.   Imaging Review Plain radiographs demonstrate severe degenerative joint disease of the left knee(s). The overall alignment isneutral. The bone quality appears to be adequate for age and reported activity level.      Assessment/Plan:  End stage arthritis, left knee   The patient history, physical  examination, clinical judgment of the provider and imaging studies are consistent with end stage degenerative joint disease of the left knee(s) and total knee arthroplasty is deemed medically necessary. The treatment options including medical management, injection therapy arthroscopy and arthroplasty were discussed at length. The risks and benefits of total knee arthroplasty were presented and reviewed. The risks due to aseptic loosening, infection, stiffness, patella tracking problems, thromboembolic complications and other imponderables were discussed. The patient acknowledged the explanation, agreed to proceed with the plan and consent was signed. Patient is being admitted for inpatient treatment for surgery, pain control, PT, OT, prophylactic antibiotics, VTE prophylaxis, progressive ambulation and ADL's and discharge planning. The patient is planning to be discharged  home.   Therapy Plans: outpatient therapy at [facility] Disposition: Home with wife Planned DVT Prophylaxis: Xarelto 10mg  daily DME needed: none PCP: Dr. Link Snuffer, clearance received TXA: IV Allergies: NKDA Anesthesia Concerns: none BMI: 26.9 Last HgbA1c: Not diabetic   Other: - oxycodone, robaxin, tylenol, celebrex (send ahead) - Hx of UE blood clot related to clavicle fracture which led to PE - [place] a skiier [place] - Has been getting bilateral hip IA with Dr. Ethelene Hal for years - staying overnight  Patient's anticipated LOS is less than 2 midnights, meeting these requirements: - Younger than 92 - Lives within 1 hour of care - Has a competent adult at home to recover with post-op recover - NO history of  - Chronic pain requiring opiods  - Diabetes  - Coronary Artery Disease  - Heart failure  - Heart attack  - Stroke  - DVT/VTE  - Cardiac arrhythmia  - Respiratory Failure/COPD  - Renal failure  - Anemia  - Advanced Liver disease  Rosalene Billings, PA-C Orthopedic Surgery [facility] [place] [phone]

## 2022-06-24 ENCOUNTER — Other Ambulatory Visit: Payer: Self-pay

## 2022-06-24 ENCOUNTER — Encounter (HOSPITAL_COMMUNITY)
Admission: RE | Disposition: A | Discharge status: Home / Self Care | Payer: Self-pay | Source: Home / Self Care | Attending: Orthopedic Surgery

## 2022-06-24 ENCOUNTER — Encounter (HOSPITAL_COMMUNITY): Payer: Self-pay | Admitting: Orthopedic Surgery

## 2022-06-24 ENCOUNTER — Observation Stay (HOSPITAL_COMMUNITY)
Admission: RE | Admit: 2022-06-24 | Discharge: 2022-06-25 | Disposition: A | Discharge status: Home / Self Care | Payer: Medicare Other | Attending: Orthopedic Surgery | Admitting: Orthopedic Surgery

## 2022-06-24 ENCOUNTER — Ambulatory Visit (HOSPITAL_BASED_OUTPATIENT_CLINIC_OR_DEPARTMENT_OTHER): Payer: Medicare Other | Admitting: Certified Registered"

## 2022-06-24 ENCOUNTER — Ambulatory Visit (HOSPITAL_COMMUNITY): Payer: Medicare Other | Admitting: Physician Assistant

## 2022-06-24 DIAGNOSIS — Z7982 Long term (current) use of aspirin: Secondary | ICD-10-CM | POA: Diagnosis not present

## 2022-06-24 DIAGNOSIS — G8918 Other acute postprocedural pain: Secondary | ICD-10-CM | POA: Diagnosis not present

## 2022-06-24 DIAGNOSIS — I10 Essential (primary) hypertension: Secondary | ICD-10-CM | POA: Diagnosis not present

## 2022-06-24 DIAGNOSIS — Z79899 Other long term (current) drug therapy: Secondary | ICD-10-CM | POA: Insufficient documentation

## 2022-06-24 DIAGNOSIS — M1712 Unilateral primary osteoarthritis, left knee: Secondary | ICD-10-CM | POA: Diagnosis not present

## 2022-06-24 DIAGNOSIS — Z96652 Presence of left artificial knee joint: Secondary | ICD-10-CM

## 2022-06-24 HISTORY — PX: TOTAL KNEE ARTHROPLASTY: SHX125

## 2022-06-24 SURGERY — ARTHROPLASTY, KNEE, TOTAL
Anesthesia: Monitor Anesthesia Care | Site: Knee | Laterality: Left

## 2022-06-24 MED ORDER — MIDAZOLAM HCL 2 MG/2ML IJ SOLN: 1.0000 mg | INTRAMUSCULAR | Status: DC

## 2022-06-24 MED ORDER — 0.9 % SODIUM CHLORIDE (POUR BTL) OPTIME
TOPICAL | Status: DC | PRN
  Administered 2022-06-24: 1000 mL

## 2022-06-24 MED ORDER — IRBESARTAN 150 MG PO TABS
150.0000 mg | ORAL_TABLET | Freq: Every morning | ORAL | Status: DC
  Administered 2022-06-25: 150 mg via ORAL
  Filled 2022-06-24: qty 1

## 2022-06-24 MED ORDER — ONDANSETRON HCL 4 MG/2ML IJ SOLN: 4.0000 mg | Freq: Four times a day (QID) | INTRAMUSCULAR | Status: DC | PRN

## 2022-06-24 MED ORDER — SODIUM CHLORIDE 0.9 % IV SOLN: INTRAVENOUS | Status: DC

## 2022-06-24 MED ORDER — MENTHOL 3 MG MT LOZG: 1.0000 | LOZENGE | OROMUCOSAL | Status: DC | PRN

## 2022-06-24 MED ORDER — PHENYLEPHRINE HCL-NACL 20-0.9 MG/250ML-% IV SOLN
INTRAVENOUS | Status: DC | PRN
  Administered 2022-06-24: 40 ug/min via INTRAVENOUS

## 2022-06-24 MED ORDER — BUPIVACAINE HCL (PF) 0.25 % IJ SOLN
INTRAMUSCULAR | Status: AC
  Filled 2022-06-24: qty 30

## 2022-06-24 MED ORDER — KETOROLAC TROMETHAMINE 30 MG/ML IJ SOLN
INTRAMUSCULAR | Status: DC | PRN
  Administered 2022-06-24: 30 mg via INTRAMUSCULAR

## 2022-06-24 MED ORDER — FENTANYL CITRATE PF 50 MCG/ML IJ SOSY
50.0000 ug | PREFILLED_SYRINGE | INTRAMUSCULAR | Status: DC
  Administered 2022-06-24: 50 ug via INTRAVENOUS
  Filled 2022-06-24: qty 2

## 2022-06-24 MED ORDER — HYDROCHLOROTHIAZIDE 12.5 MG PO TABS
12.5000 mg | ORAL_TABLET | Freq: Every day | ORAL | Status: DC
  Filled 2022-06-24: qty 1

## 2022-06-24 MED ORDER — CEFAZOLIN SODIUM-DEXTROSE 2-4 GM/100ML-% IV SOLN
2.0000 g | Freq: Four times a day (QID) | INTRAVENOUS | Status: AC
  Administered 2022-06-24 – 2022-06-25 (×2): 2 g via INTRAVENOUS
  Filled 2022-06-24 (×2): qty 100

## 2022-06-24 MED ORDER — DOCUSATE SODIUM 100 MG PO CAPS
100.0000 mg | ORAL_CAPSULE | Freq: Two times a day (BID) | ORAL | Status: DC
  Administered 2022-06-24 – 2022-06-25 (×2): 100 mg via ORAL
  Filled 2022-06-24 (×2): qty 1

## 2022-06-24 MED ORDER — HYDROMORPHONE HCL 1 MG/ML IJ SOLN: 0.5000 mg | INTRAMUSCULAR | Status: DC | PRN

## 2022-06-24 MED ORDER — ONDANSETRON HCL 4 MG/2ML IJ SOLN
INTRAMUSCULAR | Status: DC | PRN
  Administered 2022-06-24: 4 mg via INTRAVENOUS

## 2022-06-24 MED ORDER — ACETAMINOPHEN 325 MG PO TABS: 325.0000 mg | ORAL_TABLET | Freq: Four times a day (QID) | ORAL | Status: DC | PRN

## 2022-06-24 MED ORDER — PHENYLEPHRINE 80 MCG/ML (10ML) SYRINGE FOR IV PUSH (FOR BLOOD PRESSURE SUPPORT)
PREFILLED_SYRINGE | INTRAVENOUS | Status: AC
  Filled 2022-06-24: qty 10

## 2022-06-24 MED ORDER — ROPIVACAINE HCL 7.5 MG/ML IJ SOLN
INTRAMUSCULAR | Status: DC | PRN
  Administered 2022-06-24: 20 mL via PERINEURAL

## 2022-06-24 MED ORDER — CEFAZOLIN SODIUM-DEXTROSE 2-4 GM/100ML-% IV SOLN
2.0000 g | INTRAVENOUS | Status: AC
  Administered 2022-06-24: 2 g via INTRAVENOUS
  Filled 2022-06-24: qty 100

## 2022-06-24 MED ORDER — OXYCODONE HCL 5 MG PO TABS: 10.0000 mg | ORAL_TABLET | ORAL | Status: DC | PRN

## 2022-06-24 MED ORDER — PHENYLEPHRINE HCL (PRESSORS) 10 MG/ML IV SOLN
INTRAVENOUS | Status: AC
  Filled 2022-06-24: qty 1

## 2022-06-24 MED ORDER — BUPIVACAINE-EPINEPHRINE (PF) 0.25% -1:200000 IJ SOLN
INTRAMUSCULAR | Status: DC | PRN
  Administered 2022-06-24: 30 mL

## 2022-06-24 MED ORDER — FLECAINIDE ACETATE 50 MG PO TABS
50.0000 mg | ORAL_TABLET | Freq: Two times a day (BID) | ORAL | Status: DC
  Administered 2022-06-24 – 2022-06-25 (×2): 50 mg via ORAL
  Filled 2022-06-24 (×2): qty 1

## 2022-06-24 MED ORDER — BUPIVACAINE IN DEXTROSE 0.75-8.25 % IT SOLN
INTRATHECAL | Status: DC | PRN
  Administered 2022-06-24: 1.8 mL via INTRATHECAL

## 2022-06-24 MED ORDER — DEXAMETHASONE SODIUM PHOSPHATE 10 MG/ML IJ SOLN
8.0000 mg | Freq: Once | INTRAMUSCULAR | Status: AC
  Administered 2022-06-24: 8 mg via INTRAVENOUS

## 2022-06-24 MED ORDER — TRANEXAMIC ACID-NACL 1000-0.7 MG/100ML-% IV SOLN
1000.0000 mg | INTRAVENOUS | Status: AC
  Administered 2022-06-24: 1000 mg via INTRAVENOUS
  Filled 2022-06-24: qty 100

## 2022-06-24 MED ORDER — PROPOFOL 10 MG/ML IV BOLUS
INTRAVENOUS | Status: DC | PRN
  Administered 2022-06-24: 20 mg via INTRAVENOUS
  Administered 2022-06-24: 30 mg via INTRAVENOUS
  Administered 2022-06-24: 20 mg via INTRAVENOUS

## 2022-06-24 MED ORDER — SODIUM CHLORIDE 0.9 % IR SOLN
Status: DC | PRN
  Administered 2022-06-24: 1000 mL

## 2022-06-24 MED ORDER — DEXAMETHASONE SODIUM PHOSPHATE 10 MG/ML IJ SOLN
INTRAMUSCULAR | Status: AC
  Filled 2022-06-24: qty 1

## 2022-06-24 MED ORDER — PROPOFOL 10 MG/ML IV BOLUS
INTRAVENOUS | Status: AC
  Filled 2022-06-24: qty 20

## 2022-06-24 MED ORDER — PHENOL 1.4 % MT LIQD: 1.0000 | OROMUCOSAL | Status: DC | PRN

## 2022-06-24 MED ORDER — PROPOFOL 500 MG/50ML IV EMUL
INTRAVENOUS | Status: AC
  Filled 2022-06-24: qty 50

## 2022-06-24 MED ORDER — POVIDONE-IODINE 10 % EX SWAB: 2.0000 | Freq: Once | CUTANEOUS | Status: DC

## 2022-06-24 MED ORDER — ATORVASTATIN CALCIUM 10 MG PO TABS
10.0000 mg | ORAL_TABLET | Freq: Every day | ORAL | Status: DC
  Administered 2022-06-25: 10 mg via ORAL
  Filled 2022-06-24: qty 1

## 2022-06-24 MED ORDER — RIVAROXABAN 10 MG PO TABS
10.0000 mg | ORAL_TABLET | Freq: Every day | ORAL | Status: DC
  Administered 2022-06-25: 10 mg via ORAL
  Filled 2022-06-24: qty 1

## 2022-06-24 MED ORDER — METOCLOPRAMIDE HCL 5 MG PO TABS: 5.0000 mg | ORAL_TABLET | Freq: Three times a day (TID) | ORAL | Status: DC | PRN

## 2022-06-24 MED ORDER — POLYETHYLENE GLYCOL 3350 17 G PO PACK
17.0000 g | PACK | Freq: Two times a day (BID) | ORAL | Status: DC
  Administered 2022-06-24 – 2022-06-25 (×2): 17 g via ORAL
  Filled 2022-06-24 (×2): qty 1

## 2022-06-24 MED ORDER — ONDANSETRON HCL 4 MG/2ML IJ SOLN
INTRAMUSCULAR | Status: AC
  Filled 2022-06-24: qty 2

## 2022-06-24 MED ORDER — SODIUM CHLORIDE (PF) 0.9 % IJ SOLN
INTRAMUSCULAR | Status: DC | PRN
  Administered 2022-06-24: 30 mL

## 2022-06-24 MED ORDER — BISACODYL 10 MG RE SUPP: 10.0000 mg | Freq: Every day | RECTAL | Status: DC | PRN

## 2022-06-24 MED ORDER — METHOCARBAMOL 500 MG IVPB - SIMPLE MED: 500.0000 mg | Freq: Four times a day (QID) | INTRAVENOUS | Status: DC | PRN

## 2022-06-24 MED ORDER — METHOCARBAMOL 500 MG PO TABS: 500.0000 mg | ORAL_TABLET | Freq: Four times a day (QID) | ORAL | Status: DC | PRN

## 2022-06-24 MED ORDER — DEXAMETHASONE SODIUM PHOSPHATE 10 MG/ML IJ SOLN
10.0000 mg | Freq: Once | INTRAMUSCULAR | Status: AC
  Administered 2022-06-25: 10 mg via INTRAVENOUS
  Filled 2022-06-24: qty 1

## 2022-06-24 MED ORDER — EPHEDRINE SULFATE-NACL 50-0.9 MG/10ML-% IV SOSY
PREFILLED_SYRINGE | INTRAVENOUS | Status: DC | PRN
  Administered 2022-06-24: 10 mg via INTRAVENOUS

## 2022-06-24 MED ORDER — PROPOFOL 500 MG/50ML IV EMUL
INTRAVENOUS | Status: DC | PRN
  Administered 2022-06-24: 120 ug/kg/min via INTRAVENOUS

## 2022-06-24 MED ORDER — ACETAMINOPHEN 500 MG PO TABS
1000.0000 mg | ORAL_TABLET | Freq: Four times a day (QID) | ORAL | Status: AC
  Administered 2022-06-24 – 2022-06-25 (×4): 1000 mg via ORAL
  Filled 2022-06-24 (×4): qty 2

## 2022-06-24 MED ORDER — TRANEXAMIC ACID-NACL 1000-0.7 MG/100ML-% IV SOLN
1000.0000 mg | Freq: Once | INTRAVENOUS | Status: AC
  Administered 2022-06-24: 1000 mg via INTRAVENOUS
  Filled 2022-06-24: qty 100

## 2022-06-24 MED ORDER — CHLORHEXIDINE GLUCONATE 0.12 % MT SOLN
15.0000 mL | Freq: Once | OROMUCOSAL | Status: AC
  Administered 2022-06-24: 15 mL via OROMUCOSAL

## 2022-06-24 MED ORDER — DIPHENHYDRAMINE HCL 12.5 MG/5ML PO ELIX: 12.5000 mg | ORAL_SOLUTION | ORAL | Status: DC | PRN

## 2022-06-24 MED ORDER — EPHEDRINE 5 MG/ML INJ
INTRAVENOUS | Status: AC
  Filled 2022-06-24: qty 5

## 2022-06-24 MED ORDER — KETOROLAC TROMETHAMINE 30 MG/ML IJ SOLN
INTRAMUSCULAR | Status: AC
  Filled 2022-06-24: qty 1

## 2022-06-24 MED ORDER — LACTATED RINGERS IV SOLN: INTRAVENOUS | Status: DC

## 2022-06-24 MED ORDER — ORAL CARE MOUTH RINSE: 15.0000 mL | Freq: Once | OROMUCOSAL | Status: AC

## 2022-06-24 MED ORDER — OXYCODONE HCL 5 MG PO TABS
5.0000 mg | ORAL_TABLET | ORAL | Status: DC | PRN
  Administered 2022-06-25: 5 mg via ORAL
  Filled 2022-06-24: qty 1

## 2022-06-24 MED ORDER — ONDANSETRON HCL 4 MG PO TABS: 4.0000 mg | ORAL_TABLET | Freq: Four times a day (QID) | ORAL | Status: DC | PRN

## 2022-06-24 MED ORDER — METOCLOPRAMIDE HCL 5 MG/ML IJ SOLN: 5.0000 mg | Freq: Three times a day (TID) | INTRAMUSCULAR | Status: DC | PRN

## 2022-06-24 MED ORDER — SODIUM CHLORIDE (PF) 0.9 % IJ SOLN
INTRAMUSCULAR | Status: AC
  Filled 2022-06-24: qty 50

## 2022-06-24 MED ORDER — PROPOFOL 1000 MG/100ML IV EMUL
INTRAVENOUS | Status: AC
  Filled 2022-06-24: qty 100

## 2022-06-24 MED ORDER — PHENYLEPHRINE 80 MCG/ML (10ML) SYRINGE FOR IV PUSH (FOR BLOOD PRESSURE SUPPORT)
PREFILLED_SYRINGE | INTRAVENOUS | Status: DC | PRN
  Administered 2022-06-24 (×2): 80 ug via INTRAVENOUS

## 2022-06-24 SURGICAL SUPPLY — 59 items
ADH SKN CLS APL DERMABOND .7 (GAUZE/BANDAGES/DRESSINGS) ×1
ATTUNE MED ANAT PAT 41 KNEE (Knees) IMPLANT
BAG COUNTER SPONGE SURGICOUNT (BAG) IMPLANT
BAG SPEC THK2 15X12 ZIP CLS (MISCELLANEOUS)
BAG SPNG CNTER NS LX DISP (BAG)
BAG ZIPLOCK 12X15 (MISCELLANEOUS) IMPLANT
BASE TIBIAL CEM ATTUNE SZ 7 (Knees) ×1 IMPLANT
BASEPLATE TIB CEM ATTUNE SZ7 (Knees) IMPLANT
BLADE SAW SGTL 11.0X1.19X90.0M (BLADE) IMPLANT
BLADE SAW SGTL 13.0X1.19X90.0M (BLADE) ×1 IMPLANT
BNDG CMPR 5X62 HK CLSR LF (GAUZE/BANDAGES/DRESSINGS) ×1
BNDG CMPR MED 10X6 ELC LF (GAUZE/BANDAGES/DRESSINGS) ×1
BNDG ELASTIC 6INX 5YD STR LF (GAUZE/BANDAGES/DRESSINGS) ×1 IMPLANT
BNDG ELASTIC 6X10 VLCR STRL LF (GAUZE/BANDAGES/DRESSINGS) IMPLANT
BOWL SMART MIX CTS (DISPOSABLE) ×1 IMPLANT
BSPLAT TIB 7 CMNT FX BRNG STRL (Knees) ×1 IMPLANT
CEMENT HV SMART SET (Cement) IMPLANT
COMP FEM CMT ATTUNE KNEE 7 LT (Joint) ×1 IMPLANT
COMPONENT FEM CMT ATTN KN 7 LT (Joint) IMPLANT
COVER SURGICAL LIGHT HANDLE (MISCELLANEOUS) ×1 IMPLANT
CUFF TOURN SGL QUICK 34 (TOURNIQUET CUFF) ×1
CUFF TRNQT CYL 34X4.125X (TOURNIQUET CUFF) ×1 IMPLANT
DERMABOND ADVANCED .7 DNX12 (GAUZE/BANDAGES/DRESSINGS) ×1 IMPLANT
DRAPE U-SHAPE 47X51 STRL (DRAPES) ×1 IMPLANT
DRESSING AQUACEL AG SP 3.5X10 (GAUZE/BANDAGES/DRESSINGS) ×1 IMPLANT
DRSG AQUACEL AG ADV 3.5X10 (GAUZE/BANDAGES/DRESSINGS) IMPLANT
DRSG AQUACEL AG SP 3.5X10 (GAUZE/BANDAGES/DRESSINGS) ×1
DURAPREP 26ML APPLICATOR (WOUND CARE) ×2 IMPLANT
ELECT REM PT RETURN 15FT ADLT (MISCELLANEOUS) ×1 IMPLANT
GLOVE BIO SURGEON STRL SZ 6 (GLOVE) ×1 IMPLANT
GLOVE BIOGEL PI IND STRL 6.5 (GLOVE) ×1 IMPLANT
GLOVE BIOGEL PI IND STRL 7.5 (GLOVE) ×1 IMPLANT
GLOVE ORTHO TXT STRL SZ7.5 (GLOVE) ×2 IMPLANT
GOWN STRL REUS W/ TWL LRG LVL3 (GOWN DISPOSABLE) ×2 IMPLANT
GOWN STRL REUS W/TWL LRG LVL3 (GOWN DISPOSABLE) ×2
HANDPIECE INTERPULSE COAX TIP (DISPOSABLE) ×1
HOLDER FOLEY CATH W/STRAP (MISCELLANEOUS) IMPLANT
INSERT TIB CMT ATTUNE 7 7 LT (Insert) IMPLANT
KIT TURNOVER KIT A (KITS) IMPLANT
MANIFOLD NEPTUNE II (INSTRUMENTS) ×1 IMPLANT
NDL SAFETY ECLIP 18X1.5 (MISCELLANEOUS) IMPLANT
NS IRRIG 1000ML POUR BTL (IV SOLUTION) ×1 IMPLANT
PACK TOTAL KNEE CUSTOM (KITS) ×1 IMPLANT
PIN FIX SIGMA LCS THRD HI (PIN) IMPLANT
PROTECTOR NERVE ULNAR (MISCELLANEOUS) ×1 IMPLANT
SET HNDPC FAN SPRY TIP SCT (DISPOSABLE) ×1 IMPLANT
SET PAD KNEE POSITIONER (MISCELLANEOUS) ×1 IMPLANT
SPIKE FLUID TRANSFER (MISCELLANEOUS) ×2 IMPLANT
SUT MNCRL AB 4-0 PS2 18 (SUTURE) ×1 IMPLANT
SUT STRATAFIX PDS+ 0 24IN (SUTURE) ×1 IMPLANT
SUT VIC AB 1 CT1 36 (SUTURE) ×1 IMPLANT
SUT VIC AB 2-0 CT1 27 (SUTURE) ×2
SUT VIC AB 2-0 CT1 TAPERPNT 27 (SUTURE) ×2 IMPLANT
SYR 3ML LL SCALE MARK (SYRINGE) ×1 IMPLANT
TOWEL GREEN STERILE FF (TOWEL DISPOSABLE) ×1 IMPLANT
TRAY FOLEY MTR SLVR 16FR STAT (SET/KITS/TRAYS/PACK) ×1 IMPLANT
TUBE SUCTION HIGH CAP CLEAR NV (SUCTIONS) ×1 IMPLANT
WATER STERILE IRR 1000ML POUR (IV SOLUTION) ×2 IMPLANT
WRAP KNEE MAXI GEL POST OP (GAUZE/BANDAGES/DRESSINGS) ×1 IMPLANT

## 2022-06-24 NOTE — Evaluation (Signed)
Physical Therapy Evaluation Patient Details Name: Stephen Roy MRN: [id number] DOB: Jun 29, 1945 Today's Date: 06/24/2022  History of Present Illness  77 yo male presents to therapy s/p L TKA on 06/24/2022 due to failure of conservative measures. Pt PMH includes but is not limited to: HTN, HDL, bradycardia, dizziness PE and L shoulder fx.  Clinical Impression    Viraaj ADRYAN REWERTS is a 77 y.o. male POD 0 s/p L TKA. Patient reports IND with mobility at baseline. Patient is now limited by functional impairments (see PT problem list below) and requires S for bed mobility and min guard and cues for transfers. Patient was able to ambulate 100 feet with RW and min guard level of assist. Patient instructed in exercise to facilitate ROM and circulation to manage edema.  Patient will benefit from continued skilled PT interventions to address impairments and progress towards PLOF. Acute PT will follow to progress mobility and stair training in preparation for safe discharge home with family support and OPPT scheduled 5/17.      Recommendations for follow up therapy are one component of a multi-disciplinary discharge planning process, led by the attending physician.  Recommendations may be updated based on patient status, additional functional criteria and insurance authorization.  Follow Up Recommendations       Assistance Recommended at Discharge Intermittent Supervision/Assistance  Patient can return home with the following  A little help with walking and/or transfers;A little help with bathing/dressing/bathroom;Assistance with cooking/housework;Assist for transportation;Help with stairs or ramp for entrance    Equipment Recommendations None recommended by PT (pt reports DME in home setting)  Recommendations for Other Services       Functional Status Assessment Patient has had a recent decline in their functional status and demonstrates the ability to make significant improvements in function in a  reasonable and predictable amount of time.     Precautions / Restrictions Precautions Precautions: Fall;Knee Restrictions Weight Bearing Restrictions: No LLE Weight Bearing: Weight bearing as tolerated      Mobility  Bed Mobility Overal bed mobility: Needs Assistance Bed Mobility: Supine to Sit     Supine to sit: HOB elevated, Supervision     General bed mobility comments: cues    Transfers Overall transfer level: Needs assistance Equipment used: Rolling walker (2 wheels) Transfers: Sit to/from Stand Sit to Stand: Min guard           General transfer comment: cues for proper UE and AD placement and for safety    Ambulation/Gait Ambulation/Gait assistance: Min guard Gait Distance (Feet): 100 Feet Assistive device: Rolling walker (2 wheels) Gait Pattern/deviations: Step-to pattern, Antalgic Gait velocity: slighlty decreased     General Gait Details: step almost through good reciprocal pattern  Stairs            Wheelchair Mobility    Modified Rankin (Stroke Patients Only)       Balance Overall balance assessment: Needs assistance Sitting-balance support: Feet supported Sitting balance-Leahy Scale: Good     Standing balance support: Bilateral upper extremity supported, During functional activity, Reliant on assistive device for balance Standing balance-Leahy Scale: Fair Standing balance comment: static standing no UE support                             Pertinent Vitals/Pain Pain Assessment Pain Assessment: 0-10 Pain Score: 2  Pain Location: L knee Pain Descriptors / Indicators: Dull, Discomfort Pain Intervention(s): Limited activity within patient's tolerance, Monitored during session, Premedicated before  session, Repositioned, Ice applied    Home Living Family/patient expects to be discharged to:: Private residence Living Arrangements: Spouse/significant other Available Help at Discharge: Family Type of Home: House Home  Access: Stairs to enter Entrance Stairs-Rails: None Entrance Stairs-Number of Steps: 2   Home Layout: Two level;Able to live on main level with bedroom/bathroom Home Equipment: Cane - single Information systems manager (2 wheels)      Prior Function Prior Level of Function : Independent/Modified Independent;Driving             Mobility Comments: IND with all ADLs, self care tasks, IADLs, driving, acitve PLOF golfing and skiing       Hand Dominance        Extremity/Trunk Assessment        Lower Extremity Assessment Lower Extremity Assessment: LLE deficits/detail LLE Deficits / Details: ankle DF/PF 5/5; SLR < 10 degree lag LLE Sensation: WNL    Cervical / Trunk Assessment Cervical / Trunk Assessment: Normal  Communication   Communication: No difficulties  Cognition                                                General Comments      Exercises Total Joint Exercises Ankle Circles/Pumps: AROM, Both, 20 reps   Assessment/Plan    PT Assessment Patient needs continued PT services  PT Problem List Decreased strength;Decreased range of motion;Decreased activity tolerance;Decreased balance;Decreased mobility;Decreased coordination;Pain       PT Treatment Interventions DME instruction;Gait training;Stair training;Functional mobility training;Therapeutic activities;Therapeutic exercise;Balance training;Neuromuscular re-education;Patient/family education;Modalities    PT Goals (Current goals can be found in the Care Plan section)  Acute Rehab PT Goals Patient Stated Goal: return to golfing and skiing PT Goal Formulation: With patient Time For Goal Achievement: 07/08/22 Potential to Achieve Goals: Good    Frequency 7X/week     Co-evaluation               AM-PAC PT "6 Clicks" Mobility  Outcome Measure Help needed turning from your back to your side while in a flat bed without using bedrails?: None Help needed moving from lying on  your back to sitting on the side of a flat bed without using bedrails?: A Little Help needed moving to and from a bed to a chair (including a wheelchair)?: A Little Help needed standing up from a chair using your arms (e.g., wheelchair or bedside chair)?: A Little Help needed to walk in hospital room?: A Little Help needed climbing 3-5 steps with a railing? : A Lot 6 Click Score: 18    End of Session Equipment Utilized During Treatment: Gait belt Activity Tolerance: No increased pain;Patient tolerated treatment well Patient left: in chair;with call bell/phone within reach;with nursing/sitter in room;with family/visitor present Nurse Communication: Mobility status PT Visit Diagnosis: Unsteadiness on feet (R26.81);Other abnormalities of gait and mobility (R26.89);Muscle weakness (generalized) (M62.81);Pain Pain - Right/Left: Left Pain - part of body: Knee    Time: 1610-9604 PT Time Calculation (min) (ACUTE ONLY): 29 min   Charges:   PT Evaluation $PT Eval Low Complexity: 1 Low PT Treatments $Gait Training: 8-22 mins        Johnny Bridge, PT Acute Rehab   Jacqualyn Posey 06/24/2022, 7:03 PM

## 2022-06-24 NOTE — Op Note (Signed)
NAME:  Stephen Roy                      MEDICAL RECORD NO.:  [id number]                             FACILITY:  [facility]      PHYSICIAN:  Madlyn Frankel. Charlann Boxer, M.D.  DATE OF BIRTH:  [birth year]      DATE OF PROCEDURE:  06/24/2022                                     OPERATIVE REPORT         PREOPERATIVE DIAGNOSIS:  Left knee osteoarthritis.      POSTOPERATIVE DIAGNOSIS:  Left knee osteoarthritis.      FINDINGS:  The patient was noted to have complete loss of cartilage and   bone-on-bone arthritis with associated osteophytes in the medial and patellofemoral compartments of   the knee with a significant synovitis and associated effusion.  The patient had failed months of conservative treatment including medications, injection therapy, activity modification.     PROCEDURE:  Left total knee replacement.      COMPONENTS USED:  DePuy Attune FB CR MS knee   system, a size 7 femur, 7 tibia, size 7 mm FB CR MS AOX insert, and 41 anatomic patellar   button.      SURGEON:  Madlyn Frankel. Charlann Boxer, M.D.      ASSISTANT:  Rosalene Billings, PA-C.      ANESTHESIA:  Regional and Spinal.      SPECIMENS:  None.      COMPLICATION:  None.      DRAINS:  None.  EBL: <200 cc      TOURNIQUET TIME:  34 min at 225 mmHg     The patient was stable to the recovery room.      INDICATION FOR PROCEDURE:  Stephen Roy is a 77 y.o. male patient of   mine.  The patient had been seen, evaluated, and treated for months conservatively in the   office with medication, activity modification, and injections.  The patient had   radiographic changes of bone-on-bone arthritis with endplate sclerosis and osteophytes noted.  Based on the radiographic changes and failed conservative measures, the patient   decided to proceed with definitive treatment, total knee replacement.  Risks of infection, DVT, component failure, need for revision surgery, neurovascular injury were reviewed in the office setting.  The postop course was  reviewed stressing the efforts to maximize post-operative satisfaction and function.  Consent was obtained for benefit of pain   relief.      PROCEDURE IN DETAIL:  The patient was brought to the operative theater.   Once adequate anesthesia, preoperative antibiotics, 2 gm of Ancef,1 gm of Tranexamic Acid, and 10 mg of Decadron administered, the patient was positioned supine with a left thigh tourniquet placed.  The  left lower extremity was prepped and draped in sterile fashion.  A time-   out was performed identifying the patient, planned procedure, and the appropriate extremity.      The left lower extremity was placed in the [facility] leg holder.  The leg was   exsanguinated, tourniquet elevated to 225 mmHg.  A midline incision was   made followed by median parapatellar arthrotomy.  Following initial   exposure, attention  was first directed to the patella.  Precut   measurement was noted to be 27 mm.  I resected down to 15 mm and used a   41 anatomic patellar button to restore patellar height as well as cover the cut surface.      The lug holes were drilled and a metal shim was placed to protect the   patella from retractors and saw blade during the procedure.      At this point, attention was now directed to the femur.  The femoral   canal was opened with a drill, irrigated to try to prevent fat emboli.  An   intramedullary rod was passed at 5 degrees valgus, 10 mm of bone was   resected off the distal femur.  Following this resection, the tibia was   subluxated anteriorly.  Using the extramedullary guide, 2 mm of bone was resected off   the proximal medial tibia.  We confirmed the gap would be   stable medially and laterally with a size 5 spacer block as well as confirmed that the tibial cut was perpendicular in the coronal plane, checking with an alignment rod.      Once this was done, I sized the femur to be a size 7 in the anterior-   posterior dimension, chose a standard component  based on medial and   lateral dimension.  The size 7 rotation block was then pinned in   position anterior referenced using the C-clamp to set rotation.  The   anterior, posterior, and  chamfer cuts were made without difficulty nor   notching making certain that I was along the anterior cortex to help   with flexion gap stability.      The final shim cut was made off the lateral aspect of distal femur.      At this point, the tibia was sized to be a size 7.  The size 7 tray was   then pinned in position after floating a tibial tray then drilled, and keel punched.  Trial reduction was now carried with a 7 femur,  7 tibia, a size 7 mm CR MS insert, and the 41 anatomic patella botton.  The knee was brought to full extension with good flexion stability with the patella   tracking through the trochlea without application of pressure.  Given   all these findings the trial components removed.  Final components were   opened and cement was mixed.  The knee was irrigated with normal saline solution and pulse lavage.  The synovial lining was   then injected with 30 cc of 0.25% Marcaine with epinephrine, 1 cc of Toradol and 30 cc of NS for a total of 61 cc.     Final implants were then cemented onto cleaned and dried cut surfaces of bone with the knee brought to extension with a size 7 mm CR MS trial insert.      Once the cement had fully cured, excess cement was removed   throughout the knee.  I confirmed that I was satisfied with the range of   motion and stability, and the final size 7 mm CR MS AOX insert was chosen.  It was   placed into the knee.      The tourniquet had been let down at 34 minutes.  No significant   hemostasis was required.  The extensor mechanism was then reapproximated using #1 Vicryl and #1 Stratafix sutures with the knee   in flexion.  The  remaining wound was closed with 2-0 Vicryl and running 4-0 Monocryl.   The knee was cleaned, dried, dressed sterilely using Dermabond  and   Aquacel dressing.  The patient was then   brought to recovery room in stable condition, tolerating the procedure   well.   Please note that Physician Assistant, Rosalene Billings, PA-C was present for the entirety of the case, and was utilized for pre-operative positioning, peri-operative retractor management, general facilitation of the procedure and for primary wound closure at the end of the case.              Madlyn Frankel Charlann Boxer, M.D.    06/24/2022 12:34 PM

## 2022-06-24 NOTE — Care Plan (Signed)
Ortho Bundle Case Management Note  Patient Details  Name: Stephen Roy MRN: [id number] Date of Birth: 22-Jul-1945                   L TKA scheduled on 06/24/22 . DCP: Home with wife DME: No needs. Has RW. PT: [facility] 06/27/22   DME Arranged:  N/A DME Agency:       Additional Comments: Please contact me with any questions of if this plan should need to change.    Despina Pole, Case Manager  [facility]  [phone] 06/24/2022, 3:43 PM

## 2022-06-24 NOTE — Anesthesia Procedure Notes (Signed)
Anesthesia Regional Block: Adductor canal block   Pre-Anesthetic Checklist: , timeout performed,  Correct Patient, Correct Site, Correct Laterality,  Correct Procedure, Correct Position, site marked,  Risks and benefits discussed,  Surgical consent,  Pre-op evaluation,  At surgeon's request and post-op pain management  Laterality: Left and Lower  Prep: chloraprep       Needles:  Injection technique: Single-shot      Needle Length: 9cm  Needle Gauge: 22     Additional Needles: Arrow StimuQuik ECHO Echogenic Stimulating PNB Needle  Procedures:,,,, ultrasound used (permanent image in chart),,    Narrative:  Start time: 06/24/2022 12:16 PM End time: 06/24/2022 12:20 PM Injection made incrementally with aspirations every 5 mL.  Performed by: Personally  Anesthesiologist: Val Eagle, MD

## 2022-06-24 NOTE — Transfer of Care (Signed)
Immediate Anesthesia Transfer of Care Note  Patient: Callaway S Rio  Procedure(s) Performed: TOTAL KNEE ARTHROPLASTY (Left: Knee)  Patient Location: PACU  Anesthesia Type:Spinal  Level of Consciousness: awake, alert , and oriented  Airway & Oxygen Therapy: Patient Spontanous Breathing and Patient connected to face mask oxygen  Post-op Assessment: Report given to RN, Post -op Vital signs reviewed and stable, and Patient moving all extremities X 4  Post vital signs: Reviewed and stable  Last Vitals:  Vitals Value Taken Time  BP 120/67 06/24/22 1416  Temp    Pulse 48 06/24/22 1419  Resp 13 06/24/22 1419  SpO2 99 % 06/24/22 1419  Vitals shown include unvalidated device data.  Last Pain:  Vitals:   06/24/22 1108  TempSrc:   PainSc: 3          Complications: No notable events documented.

## 2022-06-24 NOTE — Anesthesia Procedure Notes (Signed)
Spinal  Patient location during procedure: OR Start time: 06/24/2022 12:36 PM End time: 06/24/2022 12:46 PM Reason for block: surgical anesthesia Staffing Performed: anesthesiologist  Anesthesiologist: Val Eagle, MD Performed by: Val Eagle, MD Authorized by: Val Eagle, MD   Preanesthetic Checklist Completed: patient identified, IV checked, risks and benefits discussed, surgical consent, monitors and equipment checked, pre-op evaluation and timeout performed Spinal Block Patient position: sitting Prep: DuraPrep Patient monitoring: heart rate, cardiac monitor, continuous pulse ox and blood pressure Approach: midline Location: L2-3 Injection technique: single-shot Needle Needle type: Pencan  Needle gauge: 24 G Needle length: 9 cm Assessment Sensory level: T6 Events: CSF return and second provider

## 2022-06-24 NOTE — Interval H&P Note (Signed)
History and Physical Interval Note:  06/24/2022 11:30 AM  Stephen Roy  has presented today for surgery, with the diagnosis of Left knee osteoarthritis.  The various methods of treatment have been discussed with the patient and family. After consideration of risks, benefits and other options for treatment, the patient has consented to  Procedure(s): TOTAL KNEE ARTHROPLASTY (Left) as a surgical intervention.  The patient's history has been reviewed, patient examined, no change in status, stable for surgery.  I have reviewed the patient's chart and labs.  Questions were answered to the patient's satisfaction.     Shelda Pal

## 2022-06-24 NOTE — Anesthesia Procedure Notes (Signed)
Procedure Name: MAC Date/Time: 06/24/2022 12:35 PM  Performed by: Nelle Don, CRNAPre-anesthesia Checklist: Patient identified, Emergency Drugs available, Suction available and Patient being monitored Oxygen Delivery Method: Simple face mask

## 2022-06-24 NOTE — Discharge Instructions (Addendum)
INSTRUCTIONS AFTER JOINT REPLACEMENT   Take aspirin 81 mg twice daily after you complete the 2 weeks of Xarelto.  Pain Regimen Instructions:  - Take tylenol 1000 mg every 6-8 hours around the clock  - Take the muscle relaxant around the clock  - Then take 5 mg (1 tablet) of oxycodone every 4 hours as needed for severe pain  - Ice and elevate your leg as often as you can  Do not take over the counter pain medication until instructed by [place].  If this is not controlling your pain, or you need refills - call our office at [phone] or send a message via the [employer] portal.   Take the stool softeners provided until you are having regular bowel movements, and then you may discontinue.    Remove items at home which could result in a fall. This includes throw rugs or furniture in walking pathways ICE to the affected joint every three hours while awake for 30 minutes at a time, for at least the first 3-5 days, and then as needed for pain and swelling.  Continue to use ice for pain and swelling. You may notice swelling that will progress down to the foot and ankle.  This is normal after surgery.  Elevate your leg when you are not up walking on it.   Continue to use the breathing machine you got in the hospital (incentive spirometer) which will help keep your temperature down.  It is common for your temperature to cycle up and down following surgery, especially at night when you are not up moving around and exerting yourself.  The breathing machine keeps your lungs expanded and your temperature down.   DIET:  As you were doing prior to hospitalization, we recommend a well-balanced diet.  DRESSING / WOUND CARE / SHOWERING  Keep the surgical dressing until follow up.  The dressing is water proof, so you can shower without any extra covering.  IF THE DRESSING FALLS OFF or the wound gets wet inside, change the dressing with sterile gauze.  Please use good hand washing techniques before changing the  dressing.  Do not use any lotions or creams on the incision until instructed by your surgeon.    ACTIVITY  Increase activity slowly as tolerated, but follow the weight bearing instructions below.   No driving for 6 weeks or until further direction given by your physician.  You cannot drive while taking narcotics.  No lifting or carrying greater than 10 lbs. until further directed by your surgeon. Avoid periods of inactivity such as sitting longer than an hour when not asleep. This helps prevent blood clots.  You may return to work once you are authorized by your doctor.     WEIGHT BEARING   Weight bearing as tolerated with assist device (walker, cane, etc) as directed, use it as long as suggested by your surgeon or therapist, typically at least 4-6 weeks.   EXERCISES  Results after joint replacement surgery are often greatly improved when you follow the exercise, range of motion and muscle strengthening exercises prescribed by your doctor. Safety measures are also important to protect the joint from further injury. Any time any of these exercises cause you to have increased pain or swelling, decrease what you are doing until you are comfortable again and then slowly increase them. If you have problems or questions, call your caregiver or physical therapist for advice.   Rehabilitation is important following a joint replacement. After just a few days of immobilization,  the muscles of the leg can become weakened and shrink (atrophy).  These exercises are designed to build up the tone and strength of the thigh and leg muscles and to improve motion. Often times heat used for twenty to thirty minutes before working out will loosen up your tissues and help with improving the range of motion but do not use heat for the first two weeks following surgery (sometimes heat can increase post-operative swelling).   These exercises can be done on a training (exercise) mat, on the floor, on a table or on a  bed. Use whatever works the best and is most comfortable for you.    Use music or television while you are exercising so that the exercises are a pleasant break in your day. This will make your life better with the exercises acting as a break in your routine that you can look forward to.   Perform all exercises about fifteen times, three times per day or as directed.  You should exercise both the operative leg and the other leg as well.  Exercises include:   Quad Sets - Tighten up the muscle on the front of the thigh (Quad) and hold for 5-10 seconds.   Straight Leg Raises - With your knee straight (if you were given a brace, keep it on), lift the leg to 60 degrees, hold for 3 seconds, and slowly lower the leg.  Perform this exercise against resistance later as your leg gets stronger.  Leg Slides: Lying on your back, slowly slide your foot toward your buttocks, bending your knee up off the floor (only go as far as is comfortable). Then slowly slide your foot back down until your leg is flat on the floor again.  Angel Wings: Lying on your back spread your legs to the side as far apart as you can without causing discomfort.  Hamstring Strength:  Lying on your back, push your heel against the floor with your leg straight by tightening up the muscles of your buttocks.  Repeat, but this time bend your knee to a comfortable angle, and push your heel against the floor.  You may put a pillow under the heel to make it more comfortable if necessary.   A rehabilitation program following joint replacement surgery can speed recovery and prevent re-injury in the future due to weakened muscles. Contact your doctor or a physical therapist for more information on knee rehabilitation.    CONSTIPATION  Constipation is defined medically as fewer than three stools per week and severe constipation as less than one stool per week.  Even if you have a regular bowel pattern at home, your normal regimen is likely to be  disrupted due to multiple reasons following surgery.  Combination of anesthesia, postoperative narcotics, change in appetite and fluid intake all can affect your bowels.   YOU MUST use at least one of the following options; they are listed in order of increasing strength to get the job done.  They are all available over the counter, and you may need to use some, POSSIBLY even all of these options:    Drink plenty of fluids (prune juice may be helpful) and high fiber foods Colace 100 mg by mouth twice a day  Senokot for constipation as directed and as needed Dulcolax (bisacodyl), take with full glass of water  Miralax (polyethylene glycol) once or twice a day as needed.  If you have tried all these things and are unable to have a bowel movement in the  first 3-4 days after surgery call either your surgeon or your primary doctor.    If you experience loose stools or diarrhea, hold the medications until you stool forms back up.  If your symptoms do not get better within 1 week or if they get worse, check with your doctor.  If you experience "the worst abdominal pain ever" or develop nausea or vomiting, please contact the office immediately for further recommendations for treatment.   ITCHING:  If you experience itching with your medications, try taking only a single pain pill, or even half a pain pill at a time.  You can also use Benadryl over the counter for itching or also to help with sleep.   TED HOSE STOCKINGS:  Use stockings on both legs until for at least 2 weeks or as directed by physician office. They may be removed at night for sleeping.  MEDICATIONS:  See your medication summary on the "After Visit Summary" that nursing will review with you.  You may have some home medications which will be placed on hold until you complete the course of blood thinner medication.  It is important for you to complete the blood thinner medication as prescribed.  PRECAUTIONS:  If you experience chest pain or  shortness of breath - call 911 immediately for transfer to the hospital emergency department.   If you develop a fever greater that 101 F, purulent drainage from wound, increased redness or drainage from wound, foul odor from the wound/dressing, or calf pain - CONTACT YOUR SURGEON.                                                   FOLLOW-UP APPOINTMENTS:  If you do not already have a post-op appointment, please call the office for an appointment to be seen by your surgeon.  Guidelines for how soon to be seen are listed in your "After Visit Summary", but are typically between 1-4 weeks after surgery.  OTHER INSTRUCTIONS:   Knee Replacement:  Do not place pillow under knee, focus on keeping the knee straight while resting. CPM instructions: 0-90 degrees, 2 hours in the morning, 2 hours in the afternoon, and 2 hours in the evening. Place foam block, curve side up under heel at all times except when in CPM or when walking.  DO NOT modify, tear, cut, or change the foam block in any way.  POST-OPERATIVE OPIOID TAPER INSTRUCTIONS: It is important to wean off of your opioid medication as soon as possible. If you do not need pain medication after your surgery it is ok to stop day one. Opioids include: Codeine, Hydrocodone(Norco, Vicodin), Oxycodone(Percocet, oxycontin) and hydromorphone amongst others.  Long term and even short term use of opiods can cause: Increased pain response Dependence Constipation Depression Respiratory depression And more.  Withdrawal symptoms can include Flu like symptoms Nausea, vomiting And more Techniques to manage these symptoms Hydrate well Eat regular healthy meals Stay active Use relaxation techniques(deep breathing, meditating, yoga) Do Not substitute Alcohol to help with tapering If you have been on opioids for less than two weeks and do not have pain than it is ok to stop all together.  Plan to wean off of opioids This plan should start within one week post  op of your joint replacement. Maintain the same interval or time between taking each dose and first decrease  the dose.  Cut the total daily intake of opioids by one tablet each day Next start to increase the time between doses. The last dose that should be eliminated is the evening dose.   MAKE SURE YOU:  Understand these instructions.  Get help right away if you are not doing well or get worse.    Thank you for letting us be a part of your medical care team.  It is a privilege we respect greatly.  We hope these instructions will help you stay on track for a fast and full recovery!    Information on my medicine - XARELTO (Rivaroxaban)  This medication education was reviewed with me or my healthcare representative as part of my discharge preparation.  The pharmacist that spoke with me during my hospital stay was:    Why was Xarelto prescribed for you? Xarelto was prescribed for you to reduce the risk of blood clots forming after orthopedic surgery. The medical term for these abnormal blood clots is venous thromboembolism (VTE).  What do you need to know about xarelto ? Take your Xarelto ONCE DAILY at the same time every day. You may take it either with or without food.  If you have difficulty swallowing the tablet whole, you may crush it and mix in applesauce just prior to taking your dose.  Take Xarelto exactly as prescribed by your doctor and DO NOT stop taking Xarelto without talking to the doctor who prescribed the medication.  Stopping without other VTE prevention medication to take the place of Xarelto may increase your risk of developing a clot.  After discharge, you should have regular check-up appointments with your healthcare provider that is prescribing your Xarelto.    What do you do if you miss a dose? If you miss a dose, take it as soon as you remember on the same day then continue your regularly scheduled once daily regimen the next day. Do not take two doses of  Xarelto on the same day.   Important Safety Information A possible side effect of Xarelto is bleeding. You should call your healthcare provider right away if you experience any of the following: Bleeding from an injury or your nose that does not stop. Unusual colored urine (red or dark brown) or unusual colored stools (red or black). Unusual bruising for unknown reasons. A serious fall or if you hit your head (even if there is no bleeding).  Some medicines may interact with Xarelto and might increase your risk of bleeding while on Xarelto. To help avoid this, consult your healthcare provider or pharmacist prior to using any new prescription or non-prescription medications, including herbals, vitamins, non-steroidal anti-inflammatory drugs (NSAIDs) and supplements.  This website has more information on Xarelto: VisitDestination.com.br.

## 2022-06-25 ENCOUNTER — Encounter (HOSPITAL_COMMUNITY): Payer: Self-pay | Admitting: Orthopedic Surgery

## 2022-06-25 DIAGNOSIS — Z79899 Other long term (current) drug therapy: Secondary | ICD-10-CM | POA: Diagnosis not present

## 2022-06-25 DIAGNOSIS — M1712 Unilateral primary osteoarthritis, left knee: Secondary | ICD-10-CM | POA: Diagnosis not present

## 2022-06-25 DIAGNOSIS — I10 Essential (primary) hypertension: Secondary | ICD-10-CM | POA: Diagnosis not present

## 2022-06-25 DIAGNOSIS — Z7982 Long term (current) use of aspirin: Secondary | ICD-10-CM | POA: Diagnosis not present

## 2022-06-25 LAB — CBC
HCT: 32.3 % — ABNORMAL LOW (ref 39.0–52.0)
Hemoglobin: 11 g/dL — ABNORMAL LOW (ref 13.0–17.0)
MCH: 31.9 pg (ref 26.0–34.0)
MCHC: 34.1 g/dL (ref 30.0–36.0)
MCV: 93.6 fL (ref 80.0–100.0)
Platelets: 155 10*3/uL (ref 150–400)
RBC: 3.45 MIL/uL — ABNORMAL LOW (ref 4.22–5.81)
RDW: 13.1 % (ref 11.5–15.5)
WBC: 9.4 10*3/uL (ref 4.0–10.5)
nRBC: 0 % (ref 0.0–0.2)

## 2022-06-25 LAB — BASIC METABOLIC PANEL
Anion gap: 7 (ref 5–15)
BUN: 17 mg/dL (ref 8–23)
CO2: 22 mmol/L (ref 22–32)
Calcium: 8.5 mg/dL — ABNORMAL LOW (ref 8.9–10.3)
Chloride: 108 mmol/L (ref 98–111)
Creatinine, Ser: 0.94 mg/dL (ref 0.61–1.24)
GFR, Estimated: >60 mL/min (ref >60)
Glucose, Bld: 132 mg/dL — ABNORMAL HIGH (ref 70–99)
Potassium: 4 mmol/L (ref 3.5–5.1)
Sodium: 137 mmol/L (ref 135–145)

## 2022-06-25 NOTE — TOC Transition Note (Signed)
Transition of Care [facility]) - CM/SW Discharge Note   Patient Details  Name: Stephen Roy MRN: [id number] Date of Birth: 1945/03/23  [facility] CM/SW Contact:  Amada Jupiter, LCSW Phone Number: 06/25/2022, 10:22 AM   Clinical Narrative:     Met with pt and confirming he has needed DME at home.  OPPT already arranged with [facility].  No TOC needs.  Final next level of care: OP Rehab Barriers to Discharge: No Barriers Identified   Patient Goals and CMS Choice      Discharge Placement                         Discharge Plan and Services Additional resources added to the After Visit Summary for                  DME Arranged: N/A                    Social Determinants of Health (SDOH) Interventions SDOH Screenings   Food Insecurity: No Food Insecurity (06/24/2022)  Housing: Low Risk  (06/24/2022)  Transportation Needs: No Transportation Needs (06/24/2022)  Utilities: Not At Risk (06/24/2022)  Tobacco Use: Low Risk  (06/24/2022)     Readmission Risk Interventions     No data to display

## 2022-06-25 NOTE — Progress Notes (Signed)
Physical Therapy Treatment Patient Details Name: Stephen Roy MRN: [id number] DOB: September 16, 1945 Today's Date: 06/25/2022   History of Present Illness 77 yo male presents to therapy s/p L TKA on 06/24/2022 due to failure of conservative measures. Pt PMH includes but is not limited to: HTN, HDL, bradycardia, dizziness PE and L shoulder fx.    PT Comments    POD # 1 pm session Assisted with amb in hallway, practiced stairs for a second time with Spouse. Then returned to room to perform some TE's following HEP handout.  Instructed on proper tech, freq as well as use of ICE.   Addressed all mobility questions, discussed appropriate activity, educated on use of ICE.  Pt ready for D/C to home.   Recommendations for follow up therapy are one component of a multi-disciplinary discharge planning process, led by the attending physician.  Recommendations may be updated based on patient status, additional functional criteria and insurance authorization.  Follow Up Recommendations       Assistance Recommended at Discharge Intermittent Supervision/Assistance  Patient can return home with the following A little help with walking and/or transfers;A little help with bathing/dressing/bathroom;Assistance with cooking/housework;Assist for transportation;Help with stairs or ramp for entrance   Equipment Recommendations  None recommended by PT    Recommendations for Other Services       Precautions / Restrictions Precautions Precautions: Fall;Knee Precaution Comments: instructed no pillow under knee Restrictions Weight Bearing Restrictions: No LLE Weight Bearing: Weight bearing as tolerated     Mobility  Bed Mobility               General bed mobility comments: OOB in recliner    Transfers Overall transfer level: Needs assistance Equipment used: Rolling walker (2 wheels) Transfers: Sit to/from Stand Sit to Stand: Supervision           General transfer comment: cues for proper UE  and AD placement and for safety with turns    Ambulation/Gait Ambulation/Gait assistance: Supervision Gait Distance (Feet): 125 Feet Assistive device: Rolling walker (2 wheels) Gait Pattern/deviations: Step-to pattern, Decreased stance time - left Gait velocity: slighlty decreased     General Gait Details: step almost through good reciprocal pattern.  Had Spouse "hands on" amb pt using safety belt.   Stairs Stairs: Yes Stairs assistance: Min assist Stair Management: No rails, Step to pattern, Forwards, With walker Number of Stairs: 2 General stair comments: with Spouse present for Education/Instruction on proper tech up stairs forward with walker   Wheelchair Mobility    Modified Rankin (Stroke Patients Only)       Balance                                            Cognition Arousal/Alertness: Awake/alert Behavior During Therapy: WFL for tasks assessed/performed Overall Cognitive Status: Within Functional Limits for tasks assessed                                 General Comments: AxO x 3 very motivted        Exercises  05 reps all seated TE's following HEP handout    General Comments        Pertinent Vitals/Pain Pain Assessment Pain Assessment: 0-10 Pain Score: 5  Pain Location: L knee Pain Descriptors / Indicators: Aching, Operative site guarding, Sore, Tender Pain  Intervention(s): Monitored during session, Premedicated before session, Repositioned, Ice applied    Home Living                          Prior Function            PT Goals (current goals can now be found in the care plan section) Progress towards PT goals: Progressing toward goals    Frequency    7X/week      PT Plan Current plan remains appropriate    Co-evaluation              AM-PAC PT "6 Clicks" Mobility   Outcome Measure  Help needed turning from your back to your side while in a flat bed without using bedrails?: A  Little Help needed moving from lying on your back to sitting on the side of a flat bed without using bedrails?: A Little Help needed moving to and from a bed to a chair (including a wheelchair)?: A Little Help needed standing up from a chair using your arms (e.g., wheelchair or bedside chair)?: A Little Help needed to walk in hospital room?: A Little Help needed climbing 3-5 steps with a railing? : A Little 6 Click Score: 18    End of Session Equipment Utilized During Treatment: Gait belt Activity Tolerance: Patient tolerated treatment well Patient left: in chair;with call bell/phone within reach;with family/visitor present Nurse Communication: Mobility status PT Visit Diagnosis: Unsteadiness on feet (R26.81);Other abnormalities of gait and mobility (R26.89);Muscle weakness (generalized) (M62.81);Pain Pain - Right/Left: Left Pain - part of body: Knee     Time: 1410-1438 PT Time Calculation (min) (ACUTE ONLY): 28 min  Charges:  $Gait Training: 8-22 mins $Therapeutic Exercise: 8-22 mins                     {Dagen Beevers  PTA Acute  [employer] M-F          [phone]

## 2022-06-25 NOTE — Progress Notes (Addendum)
   Subjective: 1 Day Post-Op Procedure(s) (LRB): TOTAL KNEE ARTHROPLASTY (Left) Patient reports pain as mild.   Patient seen in rounds with Dr. Charlann Boxer. Patient is well, and has had no acute complaints or problems. No acute events overnight. Foley catheter removed. Patient ambulated 100 feet with PT.  We will continue therapy today.   Objective: Vital signs in last 24 hours: Temp:  [97.4 F (36.3 C)-98.4 F (36.9 C)] 97.9 F (36.6 C) (05/15 0458) Pulse Rate:  [48-79] 55 (05/15 0458) Resp:  [7-18] 18 (05/15 0458) BP: (120-197)/(67-103) 129/73 (05/15 0458) SpO2:  [96 %-100 %] 96 % (05/15 0458) Weight:  [93 kg] 93 kg (05/14 1108)  Intake/Output from previous day:  Intake/Output Summary (Last 24 hours) at 06/25/2022 0744 Last data filed at 06/25/2022 0725 Gross per 24 hour  Intake 2500.28 ml  Output 2350 ml  Net 150.28 ml     Intake/Output this shift: Total I/O In: -  Out: 100 [Urine:100]  Labs: Recent Labs    06/25/22 0317  HGB 11.0*   Recent Labs    06/25/22 0317  WBC 9.4  RBC 3.45*  HCT 32.3*  PLT 155   Recent Labs    06/25/22 0317  NA 137  K 4.0  CL 108  CO2 22  BUN 17  CREATININE 0.94  GLUCOSE 132*  CALCIUM 8.5*   No results for input(s): "LABPT", "INR" in the last 72 hours.  Exam: General - Patient is Alert and Oriented Extremity - Neurologically intact Sensation intact distally Intact pulses distally Dorsiflexion/Plantar flexion intact Dressing - dressing C/D/I Motor Function - intact, moving foot and toes well on exam.   Past Medical History:  Diagnosis Date   Abnormal EKG    Arthritis    Bradycardia    Dizziness    GERD (gastroesophageal reflux disease)    Hyperlipidemia    Hypertension     Assessment/Plan: 1 Day Post-Op Procedure(s) (LRB): TOTAL KNEE ARTHROPLASTY (Left) Principal Problem:   S/P total knee arthroplasty, left  Estimated body mass index is 26.32 kg/m as calculated from the following:   Height as of this  encounter: 6\' 2"  (1.88 m).   Weight as of this encounter: 93 kg. Advance diet Up with therapy D/C IV fluids   Patient's anticipated LOS is less than 2 midnights, meeting these requirements: - Younger than 73 - Lives within 1 hour of care - Has a competent adult at home to recover with post-op recover - NO history of  - Chronic pain requiring opiods  - Diabetes  - Coronary Artery Disease  - Heart failure  - Heart attack  - Stroke  - DVT/VTE  - Cardiac arrhythmia  - Respiratory Failure/COPD  - Renal failure  - Anemia  - Advanced Liver disease     DVT Prophylaxis - Xarelto Weight bearing as tolerated.  Hgb stable at 11 this AM. Has his rx at home already. Daughter will be by this afternoon to pick him up.   Plan is to go Home after hospital stay. Plan for discharge today following 1-2 sessions of PT as long as they are meeting their goals. Patient is scheduled for OPPT. Follow up in the office in 2 weeks.   Dennie Bible, PA-C Orthopedic Surgery [phone] 06/25/2022, 7:44 AM

## 2022-06-25 NOTE — Progress Notes (Signed)
Physical Therapy Treatment Patient Details Name: Stephen Roy MRN: [id number] DOB: 1945-08-02 Today's Date: 06/25/2022   History of Present Illness 77 yo male presents to therapy s/p L TKA on 06/24/2022 due to failure of conservative measures. Pt PMH includes but is not limited to: HTN, HDL, bradycardia, dizziness PE and L shoulder fx.    PT Comments    POD # 1 am session Pt is AxO x 3 and very motivated Retired [employer] "among other things".  Pt was OOB in bathroom with NT.  "Feels good to walk".  Assisted with amb in hallway.  General Gait Details: step almost through good reciprocal pattern.  Had Spouse "hands on" amb pt using safety belt. Practiced stairs. General stair comments: with Spouse present for Education/Instruction on proper tech up stairs forward with walker.  Then returned to room to perform some TE's following HEP handout.  Instructed on proper tech, freq as well as use of ICE.   Will see pt again this afternoon to complete HEP Education and ensure a safe D/C set for later today.  Recommendations for follow up therapy are one component of a multi-disciplinary discharge planning process, led by the attending physician.  Recommendations may be updated based on patient status, additional functional criteria and insurance authorization.  Follow Up Recommendations       Assistance Recommended at Discharge Intermittent Supervision/Assistance  Patient can return home with the following A little help with walking and/or transfers;A little help with bathing/dressing/bathroom;Assistance with cooking/housework;Assist for transportation;Help with stairs or ramp for entrance   Equipment Recommendations  None recommended by PT    Recommendations for Other Services       Precautions / Restrictions Precautions Precautions: Fall;Knee Precaution Comments: instructed no pillow under knee Restrictions Weight Bearing Restrictions: No LLE Weight Bearing: Weight bearing as tolerated      Mobility  Bed Mobility               General bed mobility comments: OOB in bathroom    Transfers Overall transfer level: Needs assistance Equipment used: Rolling walker (2 wheels) Transfers: Sit to/from Stand Sit to Stand: Supervision, Min guard           General transfer comment: cues for proper UE and AD placement and for safety with turns    Ambulation/Gait Ambulation/Gait assistance: Supervision, Min guard Gait Distance (Feet): 115 Feet Assistive device: Rolling walker (2 wheels) Gait Pattern/deviations: Step-to pattern, Decreased stance time - left Gait velocity: slighlty decreased     General Gait Details: step almost through good reciprocal pattern.  Had Spouse "hands on" amb pt using safety belt.   Stairs Stairs: Yes Stairs assistance: Min assist Stair Management: No rails, Step to pattern, Forwards, With walker Number of Stairs: 2 General stair comments: with Spouse present for Education/Instruction on proper tech up stairs forward with walker   Wheelchair Mobility    Modified Rankin (Stroke Patients Only)       Balance                                            Cognition Arousal/Alertness: Awake/alert Behavior During Therapy: WFL for tasks assessed/performed Overall Cognitive Status: Within Functional Limits for tasks assessed  General Comments: AxO x 3 very motivted        Exercises  Total Knee Replacement TE's following HEP handout 10 reps B LE ankle pumps 05 reps towel squeezes 05 reps knee presses 05 reps heel slides  05 reps SAQ's 05 reps SLR's 05 reps ABD Educated on use of gait belt to assist with TE's Followed by ICE     General Comments        Pertinent Vitals/Pain Pain Assessment Pain Assessment: 0-10 Pain Score: 5  Pain Location: L knee Pain Descriptors / Indicators: Aching, Operative site guarding, Sore, Tender Pain Intervention(s): Monitored  during session, Premedicated before session, Repositioned, Ice applied    Home Living                          Prior Function            PT Goals (current goals can now be found in the care plan section) Progress towards PT goals: Progressing toward goals    Frequency    7X/week      PT Plan Current plan remains appropriate    Co-evaluation              AM-PAC PT "6 Clicks" Mobility   Outcome Measure  Help needed turning from your back to your side while in a flat bed without using bedrails?: A Little Help needed moving from lying on your back to sitting on the side of a flat bed without using bedrails?: A Little Help needed moving to and from a bed to a chair (including a wheelchair)?: A Little Help needed standing up from a chair using your arms (e.g., wheelchair or bedside chair)?: A Little Help needed to walk in hospital room?: A Little Help needed climbing 3-5 steps with a railing? : A Little 6 Click Score: 18    End of Session Equipment Utilized During Treatment: Gait belt Activity Tolerance: Patient tolerated treatment well Patient left: in chair;with call bell/phone within reach;with family/visitor present Nurse Communication: Mobility status PT Visit Diagnosis: Unsteadiness on feet (R26.81);Other abnormalities of gait and mobility (R26.89);Muscle weakness (generalized) (M62.81);Pain Pain - Right/Left: Left Pain - part of body: Knee     Time: 1115-1145 PT Time Calculation (min) (ACUTE ONLY): 30 min  Charges:  $Gait Training: 8-22 mins $Therapeutic Exercise: 8-22 mins                     Felecia Shelling  PTA Acute  Rehabilitation Services Office M-F          [phone]

## 2022-06-25 NOTE — Progress Notes (Signed)
Patient was educated on medications. Verbalized understanding. IV removed. Patient is discharged.

## 2022-06-27 ENCOUNTER — Encounter (HOSPITAL_COMMUNITY): Payer: Self-pay | Admitting: Orthopedic Surgery

## 2022-06-27 DIAGNOSIS — M25562 Pain in left knee: Secondary | ICD-10-CM | POA: Diagnosis not present

## 2022-06-27 NOTE — Anesthesia Postprocedure Evaluation (Signed)
Anesthesia Post Note  Patient: Stephen Roy  Procedure(s) Performed: TOTAL KNEE ARTHROPLASTY (Left: Knee)     Patient location during evaluation: PACU Anesthesia Type: Regional, MAC and Spinal Level of consciousness: awake and alert Pain management: pain level controlled Vital Signs Assessment: post-procedure vital signs reviewed and stable Respiratory status: spontaneous breathing, nonlabored ventilation and respiratory function stable Cardiovascular status: stable and blood pressure returned to baseline Postop Assessment: no apparent nausea or vomiting Anesthetic complications: no   No notable events documented.  Last Vitals:  Vitals:   06/25/22 1333 06/25/22 1340  BP: (!) 148/84 (!) 148/84  Pulse: 66 66  Resp: 15 18  Temp: 36.9 C 36.9 C  SpO2: 97% 97%    Last Pain:  Vitals:   06/25/22 1340  TempSrc: Oral  PainSc:                  Stephen Roy

## 2022-06-30 DIAGNOSIS — M25562 Pain in left knee: Secondary | ICD-10-CM | POA: Diagnosis not present

## 2022-07-02 DIAGNOSIS — M25562 Pain in left knee: Secondary | ICD-10-CM | POA: Diagnosis not present

## 2022-07-03 NOTE — Discharge Summary (Signed)
Patient ID: Stephen Roy MRN: [id number] DOB/AGE: 02-22-1945 77 y.o.  Admit date: 06/24/2022 Discharge date: 06/25/2022  Admission Diagnoses:  Left knee osteoarthritis  Discharge Diagnoses:  Principal Problem:   S/P total knee arthroplasty, left   Past Medical History:  Diagnosis Date   Abnormal EKG    Arthritis    Bradycardia    Dizziness    GERD (gastroesophageal reflux disease)    Hyperlipidemia    Hypertension     Surgeries: Procedure(s): TOTAL KNEE ARTHROPLASTY on 06/24/2022   Consultants:   Discharged Condition: Improved  Hospital Course: Stephen Roy is an 77 y.o. male who was admitted 06/24/2022 for operative treatment ofS/P total knee arthroplasty, left. Patient has severe unremitting pain that affects sleep, daily activities, and work/hobbies. After pre-op clearance the patient was taken to the operating room on 06/24/2022 and underwent  Procedure(s): TOTAL KNEE ARTHROPLASTY.    Patient was given perioperative antibiotics:  Anti-infectives (From admission, onward)    Start     Dose/Rate Route Frequency Ordered Stop   06/24/22 1900  ceFAZolin (ANCEF) IVPB 2g/100 mL premix        2 g 200 mL/hr over 30 Minutes Intravenous Every 6 hours 06/24/22 1618 06/25/22 0222   06/24/22 1045  ceFAZolin (ANCEF) IVPB 2g/100 mL premix        2 g 200 mL/hr over 30 Minutes Intravenous On call to O.R. 06/24/22 1031 06/24/22 1317        Patient was given sequential compression devices, early ambulation, and chemoprophylaxis to prevent DVT. Patient worked with PT and was meeting their goals regarding safe ambulation and transfers.  Patient benefited maximally from hospital stay and there were no complications.    Recent vital signs: No data found.   Recent laboratory studies: No results for input(s): "WBC", "HGB", "HCT", "PLT", "NA", "K", "CL", "CO2", "BUN", "CREATININE", "GLUCOSE", "INR", "CALCIUM" in the last 72 hours.  Invalid input(s): "PT", "2"   Discharge  Medications:   Allergies as of 06/25/2022       Reactions   Lisinopril    Other reaction(s): impotence   Simvastatin    Other reaction(s): myalgia        Medication List     STOP taking these medications    aspirin 81 MG chewable tablet       TAKE these medications    atorvastatin 10 MG tablet Commonly known as: LIPITOR Take 10 mg by mouth daily.   FISH OIL PO Take 1 capsule by mouth daily.   flecainide 50 MG tablet Commonly known as: TAMBOCOR Take 1 tablet (50 mg total) by mouth 2 (two) times daily.   hydrochlorothiazide 12.5 MG tablet Commonly known as: HYDRODIURIL Take 12.5 mg by mouth daily.   irbesartan 150 MG tablet Commonly known as: AVAPRO Take 150 mg by mouth every morning.   multivitamin tablet Take 1 tablet by mouth daily.   sildenafil 100 MG tablet Commonly known as: VIAGRA Take 100 mg by mouth daily as needed for erectile dysfunction.               Discharge Care Instructions  (From admission, onward)           Start     Ordered   06/25/22 0000  Change dressing       Comments: Maintain surgical dressing until follow up in the clinic. If the edges start to pull up, may reinforce with tape. If the dressing is no longer working, may remove and cover with gauze and  tape, but must keep the area dry and clean.  Call with any questions or concerns.   06/25/22 0753            Diagnostic Studies: No results found.  Disposition: Discharge disposition: 01-Home or Self Care       Discharge Instructions     Call MD / Call 911   Complete by: As directed    If you experience chest pain or shortness of breath, CALL 911 and be transported to the hospital emergency room.  If you develope a fever above 101 F, pus (white drainage) or increased drainage or redness at the wound, or calf pain, call your surgeon's office.   Change dressing   Complete by: As directed    Maintain surgical dressing until follow up in the clinic. If the  edges start to pull up, may reinforce with tape. If the dressing is no longer working, may remove and cover with gauze and tape, but must keep the area dry and clean.  Call with any questions or concerns.   Constipation Prevention   Complete by: As directed    Drink plenty of fluids.  Prune juice may be helpful.  You may use a stool softener, such as Colace (over the counter) 100 mg twice a day.  Use MiraLax (over the counter) for constipation as needed.   Diet - low sodium heart healthy   Complete by: As directed    Increase activity slowly as tolerated   Complete by: As directed    Weight bearing as tolerated with assist device (walker, cane, etc) as directed, use it as long as suggested by your surgeon or therapist, typically at least 4-6 weeks.   Post-operative opioid taper instructions:   Complete by: As directed    POST-OPERATIVE OPIOID TAPER INSTRUCTIONS: It is important to wean off of your opioid medication as soon as possible. If you do not need pain medication after your surgery it is ok to stop day one. Opioids include: Codeine, Hydrocodone(Norco, Vicodin), Oxycodone(Percocet, oxycontin) and hydromorphone amongst others.  Long term and even short term use of opiods can cause: Increased pain response Dependence Constipation Depression Respiratory depression And more.  Withdrawal symptoms can include Flu like symptoms Nausea, vomiting And more Techniques to manage these symptoms Hydrate well Eat regular healthy meals Stay active Use relaxation techniques(deep breathing, meditating, yoga) Do Not substitute Alcohol to help with tapering If you have been on opioids for less than two weeks and do not have pain than it is ok to stop all together.  Plan to wean off of opioids This plan should start within one week post op of your joint replacement. Maintain the same interval or time between taking each dose and first decrease the dose.  Cut the total daily intake of opioids by  one tablet each day Next start to increase the time between doses. The last dose that should be eliminated is the evening dose.      TED hose   Complete by: As directed    Use stockings (TED hose) for 2 weeks on both leg(s).  You may remove them at night for sleeping.        Follow-up Information     Durene Romans, MD. Go on 07/09/2022.   Specialty: Orthopedic Surgery Why: You are scheduled for first post op appt on Wednesday May 29 at 2:30pm. Contact information: [address] [place] [address] [phone]  Signed: Cassandria Anger 07/03/2022, 9:42 AM

## 2022-07-04 DIAGNOSIS — M25562 Pain in left knee: Secondary | ICD-10-CM | POA: Diagnosis not present

## 2022-07-08 DIAGNOSIS — M25562 Pain in left knee: Secondary | ICD-10-CM | POA: Diagnosis not present

## 2022-07-09 DIAGNOSIS — M25552 Pain in left hip: Secondary | ICD-10-CM | POA: Diagnosis not present

## 2022-07-09 DIAGNOSIS — M25551 Pain in right hip: Secondary | ICD-10-CM | POA: Diagnosis not present

## 2022-07-10 DIAGNOSIS — M25562 Pain in left knee: Secondary | ICD-10-CM | POA: Diagnosis not present

## 2022-07-14 DIAGNOSIS — M25562 Pain in left knee: Secondary | ICD-10-CM | POA: Diagnosis not present

## 2022-07-16 DIAGNOSIS — M25562 Pain in left knee: Secondary | ICD-10-CM | POA: Diagnosis not present

## 2022-07-18 DIAGNOSIS — M25562 Pain in left knee: Secondary | ICD-10-CM | POA: Diagnosis not present

## 2022-07-21 DIAGNOSIS — M25562 Pain in left knee: Secondary | ICD-10-CM | POA: Diagnosis not present

## 2022-07-23 DIAGNOSIS — M25562 Pain in left knee: Secondary | ICD-10-CM | POA: Diagnosis not present

## 2022-08-18 DIAGNOSIS — Z471 Aftercare following joint replacement surgery: Secondary | ICD-10-CM | POA: Diagnosis not present

## 2022-08-18 DIAGNOSIS — Z96652 Presence of left artificial knee joint: Secondary | ICD-10-CM | POA: Diagnosis not present

## 2022-09-16 ENCOUNTER — Encounter: Payer: Self-pay | Admitting: Cardiology

## 2022-09-16 ENCOUNTER — Ambulatory Visit: Payer: Medicare Other | Admitting: Cardiology

## 2022-09-16 VITALS — BP 150/86 | HR 57 | Ht 74.0 in | Wt 208.8 lb

## 2022-09-16 DIAGNOSIS — I10 Essential (primary) hypertension: Secondary | ICD-10-CM | POA: Insufficient documentation

## 2022-09-16 DIAGNOSIS — I493 Ventricular premature depolarization: Secondary | ICD-10-CM | POA: Insufficient documentation

## 2022-09-16 DIAGNOSIS — R001 Bradycardia, unspecified: Secondary | ICD-10-CM | POA: Insufficient documentation

## 2022-09-16 MED ORDER — IRBESARTAN 300 MG PO TABS: 300.0000 mg | ORAL_TABLET | Freq: Every day | ORAL | 2 refills | Status: AC

## 2022-09-16 NOTE — Progress Notes (Signed)
  Electrophysiology Office Note:   Date:  09/16/2022  ID:  Stephen Roy, DOB [birth year], MRN [id number]  Primary Cardiologist: None Electrophysiologist: Stephen Brisky Jorja Loa, MD      History of Present Illness:   Stephen Roy is a 77 y.o. male with h/o PVCs, sinus bradycardia, hypertension, hyperlipidemia seen today for routine electrophysiology followup.  Since last being seen in our clinic the patient reports doing well.  He notes no further PVCs.  He is able to exercise without any issue.  His dizziness has significantly improved since starting flecainide.  he denies chest pain, palpitations, dyspnea, PND, orthopnea, nausea, vomiting, dizziness, syncope, edema, weight gain, or early satiety.   Review of systems complete and found to be negative unless listed in HPI.   EP Information / Studies Reviewed:    EKG is ordered today. Personal review as below.  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 When compared with ECG of 04-Sep-2017 04:58, Criteria for Septal infarct are no longer Present Confirmed by Stephen Roy ([id number]) on 09/16/2022 3:47:56 PM   Risk Assessment/Calculations:    Physical Exam:   VS:  BP (!) 150/86 (BP Location: Left Arm, Patient Position: Sitting, Cuff Size: Large)   Pulse (!) 57   Ht 6\' 2"  (1.88 m)   Wt 208 lb 12.8 oz (94.7 kg)   SpO2 96%   BMI 26.81 kg/m    Wt Readings from Last 3 Encounters:  09/16/22 208 lb 12.8 oz (94.7 kg)  06/24/22 205 lb (93 kg)  06/12/22 200 lb 3.2 oz (90.8 kg)     GEN: Well nourished, well developed in no acute distress NECK: No JVD; No carotid bruits CARDIAC: Regular rate and rhythm, no murmurs, rubs, gallops RESPIRATORY:  Clear to auscultation without rales, wheezing or rhonchi  ABDOMEN: Soft, non-tender,  non-distended EXTREMITIES:  No edema; No deformity   ASSESSMENT AND PLAN:    1.  PVCs: Currently on flecainide.  Reduction in PVC burden and feeling improved.  Stephen Roy continue with current management.  2.  Hypertension: Blood pressure elevated today.  He was started on HCTZ by his primary physician, though he did not like the diuretic effect.  Stephen Roy increase Avapro to 300 mg.  3.  Sinus bradycardia: Remains asymptomatic  Follow up with EP APP in 6 months  Signed, Stephen Virnig Jorja Loa, MD

## 2022-09-16 NOTE — Patient Instructions (Addendum)
Medication Instructions:  Your physician has recommended you make the following change in your medication: INCREASE Avapro to 300 mg daily  *If you need a refill on your cardiac medications before your next appointment, please call your pharmacy*   Lab Work: None ordered If you have labs (blood work) drawn today and your tests are completely normal, you will receive your results only by: MyChart Message (if you have MyChart) OR A paper copy in the mail If you have any lab test that is abnormal or we need to change your treatment, we will call you to review the results.   Testing/Procedures: None ordered   Follow-Up: At [facility], you and your health needs are our priority.  As part of our continuing mission to provide you with exceptional heart care, we have created designated Provider Care Teams.  These Care Teams include your primary Cardiologist (physician) and Advanced Practice Providers (APPs -  Physician Assistants and Nurse Practitioners) who all work together to provide you with the care you need, when you need it.  We recommend signing up for the patient portal called "MyChart".  Sign up information is provided on this After Visit Summary.  MyChart is used to connect with patients for Virtual Visits (Telemedicine).  Patients are able to view lab/test results, encounter notes, upcoming appointments, etc.  Non-urgent messages can be sent to your provider as well.   To learn more about what you can do with MyChart, go to [email or web address].    Your next appointment:   12 month(s)  The format for your next appointment:   In Person  Provider:   You will see one of the following Advanced Practice Providers on your designated Care Team:   Francis Dowse, [place]" [place], [place] Canary Brim, NP  {  Thank you for choosing [facility]!!   Dory Horn, RN [phone]

## 2022-09-16 NOTE — Addendum Note (Signed)
Addended by: Baird Lyons on: 09/16/2022 04:07 PM   Modules accepted: Orders

## 2022-09-29 DIAGNOSIS — M720 Palmar fascial fibromatosis [Dupuytren]: Secondary | ICD-10-CM | POA: Diagnosis not present

## 2022-10-08 DIAGNOSIS — E785 Hyperlipidemia, unspecified: Secondary | ICD-10-CM | POA: Diagnosis not present

## 2022-10-08 DIAGNOSIS — R972 Elevated prostate specific antigen [PSA]: Secondary | ICD-10-CM | POA: Diagnosis not present

## 2022-10-08 DIAGNOSIS — I10 Essential (primary) hypertension: Secondary | ICD-10-CM | POA: Diagnosis not present

## 2022-10-15 DIAGNOSIS — R001 Bradycardia, unspecified: Secondary | ICD-10-CM | POA: Diagnosis not present

## 2022-10-15 DIAGNOSIS — I10 Essential (primary) hypertension: Secondary | ICD-10-CM | POA: Diagnosis not present

## 2022-10-15 DIAGNOSIS — I350 Nonrheumatic aortic (valve) stenosis: Secondary | ICD-10-CM | POA: Diagnosis not present

## 2022-10-15 DIAGNOSIS — Z23 Encounter for immunization: Secondary | ICD-10-CM | POA: Diagnosis not present

## 2022-10-15 DIAGNOSIS — Z1339 Encounter for screening examination for other mental health and behavioral disorders: Secondary | ICD-10-CM | POA: Diagnosis not present

## 2022-10-15 DIAGNOSIS — I493 Ventricular premature depolarization: Secondary | ICD-10-CM | POA: Diagnosis not present

## 2022-10-15 DIAGNOSIS — Z0000 Encounter for general adult medical examination without abnormal findings: Secondary | ICD-10-CM | POA: Diagnosis not present

## 2022-10-15 DIAGNOSIS — Z1331 Encounter for screening for depression: Secondary | ICD-10-CM | POA: Diagnosis not present

## 2022-10-15 DIAGNOSIS — N529 Male erectile dysfunction, unspecified: Secondary | ICD-10-CM | POA: Diagnosis not present

## 2022-10-15 DIAGNOSIS — M25559 Pain in unspecified hip: Secondary | ICD-10-CM | POA: Diagnosis not present

## 2022-10-15 DIAGNOSIS — E785 Hyperlipidemia, unspecified: Secondary | ICD-10-CM | POA: Diagnosis not present

## 2022-10-15 DIAGNOSIS — R82998 Other abnormal findings in urine: Secondary | ICD-10-CM | POA: Diagnosis not present

## 2022-10-22 DIAGNOSIS — M25551 Pain in right hip: Secondary | ICD-10-CM | POA: Diagnosis not present

## 2022-10-22 DIAGNOSIS — M25552 Pain in left hip: Secondary | ICD-10-CM | POA: Diagnosis not present

## 2022-10-29 DIAGNOSIS — M160 Bilateral primary osteoarthritis of hip: Secondary | ICD-10-CM | POA: Diagnosis not present

## 2022-11-03 DIAGNOSIS — H5212 Myopia, left eye: Secondary | ICD-10-CM | POA: Diagnosis not present

## 2022-11-03 DIAGNOSIS — H2513 Age-related nuclear cataract, bilateral: Secondary | ICD-10-CM | POA: Diagnosis not present

## 2022-11-03 DIAGNOSIS — H5201 Hypermetropia, right eye: Secondary | ICD-10-CM | POA: Diagnosis not present

## 2022-11-10 DIAGNOSIS — D2261 Melanocytic nevi of right upper limb, including shoulder: Secondary | ICD-10-CM | POA: Diagnosis not present

## 2022-11-10 DIAGNOSIS — D225 Melanocytic nevi of trunk: Secondary | ICD-10-CM | POA: Diagnosis not present

## 2022-11-10 DIAGNOSIS — D2262 Melanocytic nevi of left upper limb, including shoulder: Secondary | ICD-10-CM | POA: Diagnosis not present

## 2022-11-10 DIAGNOSIS — L570 Actinic keratosis: Secondary | ICD-10-CM | POA: Diagnosis not present

## 2022-11-10 DIAGNOSIS — D485 Neoplasm of uncertain behavior of skin: Secondary | ICD-10-CM | POA: Diagnosis not present

## 2022-11-10 DIAGNOSIS — L821 Other seborrheic keratosis: Secondary | ICD-10-CM | POA: Diagnosis not present

## 2022-11-10 DIAGNOSIS — L812 Freckles: Secondary | ICD-10-CM | POA: Diagnosis not present

## 2022-11-27 DIAGNOSIS — M720 Palmar fascial fibromatosis [Dupuytren]: Secondary | ICD-10-CM | POA: Diagnosis not present

## 2022-12-01 DIAGNOSIS — M25641 Stiffness of right hand, not elsewhere classified: Secondary | ICD-10-CM | POA: Diagnosis not present

## 2022-12-01 DIAGNOSIS — M720 Palmar fascial fibromatosis [Dupuytren]: Secondary | ICD-10-CM | POA: Diagnosis not present

## 2022-12-15 DIAGNOSIS — M720 Palmar fascial fibromatosis [Dupuytren]: Secondary | ICD-10-CM | POA: Diagnosis not present

## 2022-12-15 DIAGNOSIS — M25641 Stiffness of right hand, not elsewhere classified: Secondary | ICD-10-CM | POA: Diagnosis not present

## 2022-12-19 ENCOUNTER — Other Ambulatory Visit: Payer: Self-pay | Admitting: Cardiology

## 2022-12-19 NOTE — Telephone Encounter (Signed)
Patient states that he is normally mailed his prescriptions because he spends half his time here in [place] and half his time in [place]. He is requesting the prescription for:  flecainide (TAMBOCOR) 50 MG tablet    He needs a 3 month supply and mailed to his home address:  [address] [place] [address]   Routing to refills and Sherri, pt wanted to speak with Roanna Raider because this has been done in the past for him.

## 2022-12-22 ENCOUNTER — Telehealth: Payer: Self-pay | Admitting: Cardiology

## 2022-12-22 ENCOUNTER — Other Ambulatory Visit (HOSPITAL_COMMUNITY): Payer: Self-pay

## 2022-12-22 MED ORDER — FLECAINIDE ACETATE 50 MG PO TABS: 50.0000 mg | ORAL_TABLET | Freq: Two times a day (BID) | ORAL | 2 refills | Status: DC

## 2022-12-22 MED ORDER — FLECAINIDE ACETATE 50 MG PO TABS
50.0000 mg | ORAL_TABLET | Freq: Two times a day (BID) | ORAL | 2 refills | Status: DC
  Filled 2022-12-22: qty 180, 90d supply, fill #0

## 2022-12-22 NOTE — Telephone Encounter (Signed)
*  STAT* If patient is at the pharmacy, call can be transferred to refill team.   1. Which medications need to be refilled? (please list name of each medication and dose if known) flecainide (TAMBOCOR) 50 MG tablet  2. Which pharmacy/location (including street and city if local pharmacy) is medication to be sent to? Patient would like a written prescription mailed to his home address if possible - [address] [place] [address]   3. Do they need a 30 day or 90 day supply?   90 day supply

## 2022-12-22 NOTE — Telephone Encounter (Signed)
Attempted pt again, no answer, left another voicemail.  If pt needs 3 month Rx supply then this should not be an issue. Will forward back to refill team to address printed Rx need. Refill team -- I was going to clarify with pt what he needed before printing Rx.  And verify to mail it vs he is going to pick it up.

## 2022-12-22 NOTE — Addendum Note (Signed)
Addended by: Adriana Simas, Suzzanne Brunkhorst L on: 12/22/2022 12:13 PM   Modules accepted: Orders

## 2022-12-22 NOTE — Telephone Encounter (Signed)
This Pt is requesting a RX mailed to him and I'm not sure if it's possible. He's with Dr. Elberta Fortis. I have sent in a refill to [facility]. Please advise

## 2022-12-22 NOTE — Telephone Encounter (Signed)
Left message for pt.  Informed him that I would follow back up by Wednesday (let him know I was not in the office tomorrow)

## 2022-12-23 NOTE — Telephone Encounter (Signed)
Pt is requesting a printed Rx script, Dr. Elberta Fortis needs to sign this. Please address

## 2022-12-24 MED ORDER — FLECAINIDE ACETATE 50 MG PO TABS: 50.0000 mg | ORAL_TABLET | Freq: Two times a day (BID) | ORAL | 3 refills | Status: DC

## 2022-12-24 MED ORDER — FLECAINIDE ACETATE 50 MG PO TABS: 50.0000 mg | ORAL_TABLET | Freq: Two times a day (BID) | ORAL | 2 refills | Status: DC

## 2022-12-24 NOTE — Telephone Encounter (Signed)
Left message to call back  

## 2022-12-24 NOTE — Telephone Encounter (Signed)
Pt returned my call. Confirmed Rx need with patient  He understands I will get Rx ready and have MD sign tomorrow and then mail to his home address, per his request. Confirmed home address w/ pt. Pt appreciates my help with this.

## 2022-12-30 DIAGNOSIS — D485 Neoplasm of uncertain behavior of skin: Secondary | ICD-10-CM | POA: Diagnosis not present

## 2022-12-30 DIAGNOSIS — D0421 Carcinoma in situ of skin of right ear and external auricular canal: Secondary | ICD-10-CM | POA: Diagnosis not present

## 2023-01-01 DIAGNOSIS — M25561 Pain in right knee: Secondary | ICD-10-CM | POA: Diagnosis not present

## 2023-01-05 DIAGNOSIS — Z23 Encounter for immunization: Secondary | ICD-10-CM | POA: Diagnosis not present

## 2023-01-27 DIAGNOSIS — S82001A Unspecified fracture of right patella, initial encounter for closed fracture: Secondary | ICD-10-CM | POA: Diagnosis not present

## 2023-01-27 DIAGNOSIS — M160 Bilateral primary osteoarthritis of hip: Secondary | ICD-10-CM | POA: Diagnosis not present

## 2023-02-11 HISTORY — PX: CATARACT EXTRACTION: SUR2

## 2023-02-19 DIAGNOSIS — S82092A Other fracture of left patella, initial encounter for closed fracture: Secondary | ICD-10-CM | POA: Diagnosis not present

## 2023-04-07 DIAGNOSIS — C44321 Squamous cell carcinoma of skin of nose: Secondary | ICD-10-CM | POA: Diagnosis not present

## 2023-04-07 DIAGNOSIS — D485 Neoplasm of uncertain behavior of skin: Secondary | ICD-10-CM | POA: Diagnosis not present

## 2023-04-07 DIAGNOSIS — C44319 Basal cell carcinoma of skin of other parts of face: Secondary | ICD-10-CM | POA: Diagnosis not present

## 2023-04-07 DIAGNOSIS — Z08 Encounter for follow-up examination after completed treatment for malignant neoplasm: Secondary | ICD-10-CM | POA: Diagnosis not present

## 2023-04-07 DIAGNOSIS — Z808 Family history of malignant neoplasm of other organs or systems: Secondary | ICD-10-CM | POA: Diagnosis not present

## 2023-04-07 DIAGNOSIS — L578 Other skin changes due to chronic exposure to nonionizing radiation: Secondary | ICD-10-CM | POA: Diagnosis not present

## 2023-04-07 DIAGNOSIS — Z85828 Personal history of other malignant neoplasm of skin: Secondary | ICD-10-CM | POA: Diagnosis not present

## 2023-04-07 DIAGNOSIS — L570 Actinic keratosis: Secondary | ICD-10-CM | POA: Diagnosis not present

## 2023-04-11 DIAGNOSIS — C44319 Basal cell carcinoma of skin of other parts of face: Secondary | ICD-10-CM | POA: Diagnosis not present

## 2023-04-11 DIAGNOSIS — C44321 Squamous cell carcinoma of skin of nose: Secondary | ICD-10-CM | POA: Diagnosis not present

## 2023-04-21 ENCOUNTER — Telehealth: Payer: Self-pay | Admitting: *Deleted

## 2023-04-21 NOTE — Telephone Encounter (Signed)
   Pre-operative Risk Assessment    Patient Name: Stephen Roy  DOB: 09-29-1945 MRN: [id number]   Date of last office visit: 09/16/2022 Date of next office visit: NONE   Request for Surgical Clearance    Procedure:  LEFT TOTAL HIP ARTHROPLASTY  Date of Surgery:  Clearance 06/16/23                                Surgeon:  DR. MATTHEW OLIN Surgeon's Group or Practice Name:  [facility] Phone number:  [phone] Fax number:  [phone]   Type of Clearance Requested:   - Medical    Type of Anesthesia:  Spinal   Additional requests/questions:    SignedElliot Cousin   04/21/2023, 8:26 AM

## 2023-04-21 NOTE — Telephone Encounter (Signed)
 Returned call to pt.  He was made aware that we just received his clearance today and assured pt it is being processed and he will receive a call from our office in the next week or so.  Pt was thankful for the call back.

## 2023-04-21 NOTE — Telephone Encounter (Signed)
 Patient called to follow-up on the status of his clearance.

## 2023-04-22 NOTE — Telephone Encounter (Signed)
   Name: Stephen Roy  DOB: 02-06-1946  MRN: [id number]  Primary Cardiologist: None   Preoperative team, please contact this patient and set up a phone call appointment for further preoperative risk assessment. Please obtain consent and complete medication review. Thank you for your help.  I confirm that guidance regarding antiplatelet and oral anticoagulation therapy has been completed and, if necessary, noted below.  Patient is not on antiplatelet or anticoagulation therapy.  I also confirmed the patient resides in the state of [place]. As per [facility] Medical Board telemedicine laws, the patient must reside in the state in which the provider is licensed.   Denyce Robert, NP 04/22/2023, 9:54 AM [facility]

## 2023-04-22 NOTE — Telephone Encounter (Signed)
 Left message to call back to schedule tele pre op appt.

## 2023-04-23 NOTE — Telephone Encounter (Signed)
 2nd attempt to call pt to schedule tele pre op appt.

## 2023-04-23 NOTE — Telephone Encounter (Signed)
 I s/w the pt who says he is in [place] right now, however he will be leaving [place] in about 1 and 1/2 weeks. In discussing tele appt, pt asked could he be seen in the office instead. I said that will be fine. Pt also said he thinks he is supposed to see Dr. Elberta Fortis sometime this summer. I reviewed with the pt there is a recall letter for appt sometime in 09/2023. Pt asked let's schedule in office for preop clearance and his yrly f/u. Pt has been scheduled to see Otilio Saber, [facility] 06/04/23 9:20. Pt thanked me for the help. I will update all parties involved.

## 2023-04-23 NOTE — Telephone Encounter (Signed)
 Pt returning call to nurse

## 2023-05-11 DIAGNOSIS — C44321 Squamous cell carcinoma of skin of nose: Secondary | ICD-10-CM | POA: Diagnosis not present

## 2023-05-11 DIAGNOSIS — C44319 Basal cell carcinoma of skin of other parts of face: Secondary | ICD-10-CM | POA: Diagnosis not present

## 2023-05-12 DIAGNOSIS — M25559 Pain in unspecified hip: Secondary | ICD-10-CM | POA: Diagnosis not present

## 2023-05-12 DIAGNOSIS — I493 Ventricular premature depolarization: Secondary | ICD-10-CM | POA: Diagnosis not present

## 2023-05-12 DIAGNOSIS — H6993 Unspecified Eustachian tube disorder, bilateral: Secondary | ICD-10-CM | POA: Diagnosis not present

## 2023-05-12 DIAGNOSIS — E785 Hyperlipidemia, unspecified: Secondary | ICD-10-CM | POA: Diagnosis not present

## 2023-05-12 DIAGNOSIS — Z01818 Encounter for other preprocedural examination: Secondary | ICD-10-CM | POA: Diagnosis not present

## 2023-05-12 DIAGNOSIS — I10 Essential (primary) hypertension: Secondary | ICD-10-CM | POA: Diagnosis not present

## 2023-05-12 DIAGNOSIS — I350 Nonrheumatic aortic (valve) stenosis: Secondary | ICD-10-CM | POA: Diagnosis not present

## 2023-05-12 DIAGNOSIS — R001 Bradycardia, unspecified: Secondary | ICD-10-CM | POA: Diagnosis not present

## 2023-05-18 DIAGNOSIS — Z85828 Personal history of other malignant neoplasm of skin: Secondary | ICD-10-CM | POA: Diagnosis not present

## 2023-05-18 DIAGNOSIS — C44321 Squamous cell carcinoma of skin of nose: Secondary | ICD-10-CM | POA: Diagnosis not present

## 2023-06-03 NOTE — Progress Notes (Unsigned)
  Electrophysiology Office Note:   Date:  06/04/2023  ID:  AVROHOM MCKELVIN, DOB 1945-11-17, MRN [id number]  Primary Cardiologist: None Electrophysiologist: Will Cortland Ding, MD      History of Present Illness:   Stephen Roy is a 78 y.o. male with h/o PVCs, sinus bradycardia, HTN, and HLD seen today for routine electrophysiology followup.   Since last being seen in our clinic the patient reports doing very well from a cardiac perspective. Overall,  he denies chest pain, palpitations, dyspnea, PND, orthopnea, nausea, vomiting, dizziness, syncope, edema, weight gain, or early satiety.   Review of systems complete and found to be negative unless listed in HPI.   EP Information / Studies Reviewed:    EKG is ordered today. Personal review as below.       Arrhythmia/Device History No specialty comments available.   Physical Exam:   VS:  BP (!) 140/78 (BP Location: Left Arm, Patient Position: Sitting, Cuff Size: Large)   Pulse (!) 57   Ht 6\' 2"  (1.88 m)   Wt 206 lb (93.4 kg)   SpO2 96%   BMI 26.45 kg/m    Wt Readings from Last 3 Encounters:  06/04/23 206 lb (93.4 kg)  09/16/22 208 lb 12.8 oz (94.7 kg)  06/24/22 205 lb (93 kg)     GEN: No acute distress NECK: No JVD; No carotid bruits CARDIAC: Regular rate and rhythm, no murmurs, rubs, gallops RESPIRATORY:  Clear to auscultation without rales, wheezing or rhonchi  ABDOMEN: Soft, non-tender, non-distended EXTREMITIES:  No edema; No deformity   ASSESSMENT AND PLAN:    PVCs EKG today shows NSR with stable intervals Continue flecainide  50 mg BID  HTN Stable on current regimen   Sinus bradycardia Asymptomatic  Cardiac Clearance for Left Total Hip  No concerns from a cardiac perspective.  The patient is at low risk to proceed without further work up.  If the patient has new chest pain or SOB prior to surgery, they should be revaluated.    Follow up with EP APP in 6 months  Signed, Tylene Galla, PA-C

## 2023-06-04 ENCOUNTER — Ambulatory Visit: Attending: Student | Admitting: Student

## 2023-06-04 ENCOUNTER — Encounter: Payer: Self-pay | Admitting: Student

## 2023-06-04 VITALS — BP 140/78 | HR 57 | Ht 74.0 in | Wt 206.0 lb

## 2023-06-04 DIAGNOSIS — I10 Essential (primary) hypertension: Secondary | ICD-10-CM | POA: Insufficient documentation

## 2023-06-04 DIAGNOSIS — Z01818 Encounter for other preprocedural examination: Secondary | ICD-10-CM | POA: Insufficient documentation

## 2023-06-04 DIAGNOSIS — R001 Bradycardia, unspecified: Secondary | ICD-10-CM | POA: Insufficient documentation

## 2023-06-04 DIAGNOSIS — I493 Ventricular premature depolarization: Secondary | ICD-10-CM | POA: Diagnosis not present

## 2023-06-04 NOTE — Patient Instructions (Signed)
 Medication Instructions:  Your physician recommends that you continue on your current medications as directed. Please refer to the Current Medication list given to you today.  *If you need a refill on your cardiac medications before your next appointment, please call your pharmacy*  Lab Work: None ordered If you have labs (blood work) drawn today and your tests are completely normal, you will receive your results only by: MyChart Message (if you have MyChart) OR A paper copy in the mail If you have any lab test that is abnormal or we need to change your treatment, we will call you to review the results.  Follow-Up: At [facility], you and your health needs are our priority.  As part of our continuing mission to provide you with exceptional heart care, our providers are all part of one team.  This team includes your primary Cardiologist (physician) and Advanced Practice Providers or APPs (Physician Assistants and Nurse Practitioners) who all work together to provide you with the care you need, when you need it.  Your next appointment:   6 month(s)  Provider:   Loman Brooklyn, MD    We recommend signing up for the patient portal called "MyChart".  Sign up information is provided on this After Visit Summary.  MyChart is used to connect with patients for Virtual Visits (Telemedicine).  Patients are able to view lab/test results, encounter notes, upcoming appointments, etc.  Non-urgent messages can be sent to your provider as well.   To learn more about what you can do with MyChart, go to [email or web address].     1st Floor: - Lobby - Registration  - Pharmacy  - Lab - Cafe  2nd Floor: - PV Lab - Diagnostic Testing (echo, CT, nuclear med)  3rd Floor: - Vacant  4th Floor: - TCTS (cardiothoracic surgery) - AFib Clinic - Structural Heart Clinic - Vascular Surgery  - Vascular Ultrasound  5th Floor: - [facility] Card[facility] (general and EP) - Clinical Pharmacy for  coumadin, hypertension, lipid, weight-loss medications, and med management appointments    Valet parking services will be available as well.

## 2023-06-05 NOTE — Progress Notes (Addendum)
 COVID Vaccine Completed:  Date of COVID positive in last 90 days:  PCP - Barnetta Liberty, MD LOV in media tab dated 05/13/23 Cardiologist - Agatha Horsfall, MD  Medical clearance by Barnetta Liberty, MD in media tab dated 05/13/23. Cardiac clearance by Pilar Bridge, PA 06/04/23 in Epic  Chest x-ray - n/a EKG - 06/04/23 Epic- 1st degree AV block  Stress Test - yes per pt ECHO - 11/20/21 Epic Cardiac Cath - n/a Pacemaker/ICD device last checked: n/a Spinal Cord Stimulator: n/a  Bowel Prep - no  Sleep Study - n/a CPAP -   Fasting Blood Sugar - n/a Checks Blood Sugar _____ times a day  Last dose of GLP1 agonist-  N/A GLP1 instructions:  Hold 7 days before surgery    Last dose of SGLT-2 inhibitors-  N/A SGLT-2 instructions:  Hold 3 days before surgery    Blood Thinner Instructions:  Last dose:   Time: Aspirin  Instructions: ASA 81, hold 5 days Last Dose: 05/12/23  Activity level: Can go up a flight of stairs and perform activities of daily living without stopping and without symptoms of chest pain or shortness of breath.   Anesthesia review: 1st degree AV block, PVCs, HTN, aortic valve stenosis   Patient denies shortness of breath, fever, cough and chest pain at PAT appointment  Patient verbalized understanding of instructions that were given to them at the PAT appointment. Patient was also instructed that they will need to review over the PAT instructions again at home before surgery.

## 2023-06-05 NOTE — Patient Instructions (Addendum)
 SURGICAL WAITING ROOM VISITATION  Patients having surgery or a procedure may have no more than 2 support people in the waiting area - these visitors may rotate.    Children under the age of 104 must have an adult with them who is not the patient.  Due to an increase in RSV and influenza rates and associated hospitalizations, children ages 69 and under may not visit patients in [facility] hospitals.  Visitors with respiratory illnesses are discouraged from visiting and should remain at home.  If the patient needs to stay at the hospital during part of their recovery, the visitor guidelines for inpatient rooms apply. Pre-op nurse will coordinate an appropriate time for 1 support person to accompany patient in pre-op.  This support person may not rotate.    Please refer to the [facility] website for the visitor guidelines for Inpatients (after your surgery is over and you are in a regular room).    Your procedure is scheduled on: 06/16/23   Report to [facility] Main Entrance    Report to admitting at 8:45 AM   Call this number if you have problems the morning of surgery [phone]   Do not eat food :After Midnight.   After Midnight you may have the following liquids until 8:15 AM DAY OF SURGERY  Water  Non-Citrus Juices (without pulp, NO RED-Apple, White grape, White cranberry) Black Coffee (NO MILK/CREAM OR CREAMERS, sugar ok)  Clear Tea (NO MILK/CREAM OR CREAMERS, sugar ok) regular and decaf                             Plain Jell-O (NO RED)                                           Fruit ices (not with fruit pulp, NO RED)                                     Popsicles (NO RED)                                                               Sports drinks like Gatorade (NO RED)                The day of surgery:  Drink ONE (1) Pre-Surgery Clear Ensure at 8:15 AM the morning of surgery. Drink in one sitting. Do not sip.  This drink was given to you during your hospital   pre-op appointment visit. Nothing else to drink after completing the  Pre-Surgery Clear Ensure          If you have questions, please contact your surgeon's office.   FOLLOW BOWEL PREP AND ANY ADDITIONAL PRE OP INSTRUCTIONS YOU RECEIVED FROM YOUR SURGEON'S OFFICE!!!     Oral Hygiene is also important to reduce your risk of infection.                                    Remember - BRUSH  YOUR TEETH THE MORNING OF SURGERY WITH YOUR REGULAR TOOTHPASTE  DENTURES WILL BE REMOVED PRIOR TO SURGERY PLEASE DO NOT APPLY "Poly grip" OR ADHESIVES!!!   Stop all vitamins and herbal supplements 7 days before surgery.   Take these medicines the morning of surgery with A SIP OF WATER : Atorvastatin , Flecainide                                You may not have any metal on your body including jewelry, and body piercing             Do not wear lotions, powders, cologne, or deodorant              Men may shave face and neck.   Do not bring valuables to the hospital. [facility] IS NOT             RESPONSIBLE   FOR VALUABLES.   Contacts, glasses, dentures or bridgework may not be worn into surgery.   Bring small overnight bag day of surgery.   DO NOT BRING YOUR HOME MEDICATIONS TO THE HOSPITAL. PHARMACY WILL DISPENSE MEDICATIONS LISTED ON YOUR MEDICATION LIST TO YOU DURING YOUR ADMISSION IN THE HOSPITAL!    Special Instructions: Bring a copy of your healthcare power of attorney and living will documents the day of surgery if you haven't scanned them before.              Please read over the following fact sheets you were given: IF YOU HAVE QUESTIONS ABOUT YOUR PRE-OP INSTRUCTIONS PLEASE CALL [name]    If you received a COVID test during your pre-op visit  it is requested that you wear a mask when out in public, stay away from anyone that may not be feeling well and notify your surgeon if you develop symptoms. If you test positive for Covid or have been in contact with anyone that has  tested positive in the last 10 days please notify you surgeon.      Pre-operative 5 CHG Bath Instructions   You can play a key role in reducing the risk of infection after surgery. Your skin needs to be as free of germs as possible. You can reduce the number of germs on your skin by washing with CHG (chlorhexidine  gluconate) soap before surgery. CHG is an antiseptic soap that kills germs and continues to kill germs even after washing.   DO NOT use if you have an allergy to chlorhexidine /CHG or antibacterial soaps. If your skin becomes reddened or irritated, stop using the CHG and notify one of our RNs at [phone].   Please shower with the CHG soap starting 4 days before surgery using the following schedule:     Please keep in mind the following:  DO NOT shave, including legs and underarms, starting the day of your first shower.   You may shave your face at any point before/day of surgery.  Place clean sheets on your bed the day you start using CHG soap. Use a clean washcloth (not used since being washed) for each shower. DO NOT sleep with pets once you start using the CHG.   CHG Shower Instructions:  If you choose to wash your hair and private area, wash first with your normal shampoo/soap.  After you use shampoo/soap, rinse your hair and body thoroughly to remove shampoo/soap residue.  Turn the water  OFF and apply about 3 tablespoons (45 ml) of CHG  soap to a CLEAN washcloth.  Apply CHG soap ONLY FROM YOUR NECK DOWN TO YOUR TOES (washing for 3-5 minutes)  DO NOT use CHG soap on face, private areas, open wounds, or sores.  Pay special attention to the area where your surgery is being performed.  If you are having back surgery, having someone wash your back for you may be helpful. Wait 2 minutes after CHG soap is applied, then you may rinse off the CHG soap.  Pat dry with a clean towel  Put on clean clothes/pajamas   If you choose to wear lotion, please use ONLY the CHG-compatible  lotions on the back of this paper.     Additional instructions for the day of surgery: DO NOT APPLY any lotions, deodorants, cologne, or perfumes.   Put on clean/comfortable clothes.  Brush your teeth.  Ask your nurse before applying any prescription medications to the skin.      CHG Compatible Lotions   Aveeno Moisturizing lotion  Cetaphil Moisturizing Cream  Cetaphil Moisturizing Lotion  Clairol Herbal Essence Moisturizing Lotion, Dry Skin  Clairol Herbal Essence Moisturizing Lotion, Extra Dry Skin  Clairol Herbal Essence Moisturizing Lotion, Normal Skin  Curel Age Defying Therapeutic Moisturizing Lotion with Alpha Hydroxy  Curel Extreme Care Body Lotion  Curel Soothing Hands Moisturizing Hand Lotion  Curel Therapeutic Moisturizing Cream, Fragrance-Free  Curel Therapeutic Moisturizing Lotion, Fragrance-Free  Curel Therapeutic Moisturizing Lotion, Original Formula  Eucerin Daily Replenishing Lotion  Eucerin Dry Skin Therapy Plus Alpha Hydroxy Crme  Eucerin Dry Skin Therapy Plus Alpha Hydroxy Lotion  Eucerin Original Crme  Eucerin Original Lotion  Eucerin Plus Crme Eucerin Plus Lotion  Eucerin TriLipid Replenishing Lotion  Keri Anti-Bacterial Hand Lotion  Keri Deep Conditioning Original Lotion Dry Skin Formula Softly Scented  Keri Deep Conditioning Original Lotion, Fragrance Free Sensitive Skin Formula  Keri Lotion Fast Absorbing Fragrance Free Sensitive Skin Formula  Keri Lotion Fast Absorbing Softly Scented Dry Skin Formula  Keri Original Lotion  Keri Skin Renewal Lotion Keri Silky Smooth Lotion  Keri Silky Smooth Sensitive Skin Lotion  Nivea Body Creamy Conditioning Oil  Nivea Body Extra Enriched Lotion  Nivea Body Original Lotion  Nivea Body Sheer Moisturizing Lotion Nivea Crme  Nivea Skin Firming Lotion  NutraDerm 30 Skin Lotion  NutraDerm Skin Lotion  NutraDerm Therapeutic Skin Cream  NutraDerm Therapeutic Skin Lotion  ProShield Protective Hand Cream   Provon moisturizing lotion   Incentive Spirometer  An incentive spirometer is a tool that can help keep your lungs clear and active. This tool measures how well you are filling your lungs with each breath. Taking long deep breaths may help reverse or decrease the chance of developing breathing (pulmonary) problems (especially infection) following: A long period of time when you are unable to move or be active. BEFORE THE PROCEDURE  If the spirometer includes an indicator to show your best effort, your nurse or respiratory therapist will set it to a desired goal. If possible, sit up straight or lean slightly forward. Try not to slouch. Hold the incentive spirometer in an upright position. INSTRUCTIONS FOR USE  Sit on the edge of your bed if possible, or sit up as far as you can in bed or on a chair. Hold the incentive spirometer in an upright position. Breathe out normally. Place the mouthpiece in your mouth and seal your lips tightly around it. Breathe in slowly and as deeply as possible, raising the piston or the ball toward the top of the column. Hold your  breath for 3-5 seconds or for as long as possible. Allow the piston or ball to fall to the bottom of the column. Remove the mouthpiece from your mouth and breathe out normally. Rest for a few seconds and repeat Steps 1 through 7 at least 10 times every 1-2 hours when you are awake. Take your time and take a few normal breaths between deep breaths. The spirometer may include an indicator to show your best effort. Use the indicator as a goal to work toward during each repetition. After each set of 10 deep breaths, practice coughing to be sure your lungs are clear. If you have an incision (the cut made at the time of surgery), support your incision when coughing by placing a pillow or rolled up towels firmly against it. Once you are able to get out of bed, walk around indoors and cough well. You may stop using the incentive spirometer when  instructed by your caregiver.  RISKS AND COMPLICATIONS Take your time so you do not get dizzy or light-headed. If you are in pain, you may need to take or ask for pain medication before doing incentive spirometry. It is harder to take a deep breath if you are having pain. AFTER USE Rest and breathe slowly and easily. It can be helpful to keep track of a log of your progress. Your caregiver can provide you with a simple table to help with this. If you are using the spirometer at home, follow these instructions: SEEK MEDICAL CARE IF:  You are having difficultly using the spirometer. You have trouble using the spirometer as often as instructed. Your pain medication is not giving enough relief while using the spirometer. You develop fever of 100.5 F (38.1 C) or higher. SEEK IMMEDIATE MEDICAL CARE IF:  You cough up bloody sputum that had not been present before. You develop fever of 102 F (38.9 C) or greater. You develop worsening pain at or near the incision site. MAKE SURE YOU:  Understand these instructions. Will watch your condition. Will get help right away if you are not doing well or get worse. Document Released: 06/09/2006 Document Revised: 04/21/2011 Document Reviewed: 08/10/2006 [employer] Patient Information 2014 [employer], [place].   ________________________________________________________________________ WHAT IS A BLOOD TRANSFUSION? Blood Transfusion Information  A transfusion is the replacement of blood or some of its parts. Blood is made up of multiple cells which provide different functions. Red blood cells carry oxygen and are used for blood loss replacement. White blood cells fight against infection. Platelets control bleeding. Plasma helps clot blood. Other blood products are available for specialized needs, such as hemophilia or other clotting disorders. BEFORE THE TRANSFUSION  Who gives blood for transfusions?  Healthy volunteers who are fully evaluated to make sure  their blood is safe. This is blood bank blood. Transfusion therapy is the safest it has ever been in the practice of medicine. Before blood is taken from a donor, a complete history is taken to make sure that person has no history of diseases nor engages in risky social behavior (examples are intravenous drug use or sexual activity with multiple partners). The donor's travel history is screened to minimize risk of transmitting infections, such as malaria. The donated blood is tested for signs of infectious diseases, such as HIV and hepatitis. The blood is then tested to be sure it is compatible with you in order to minimize the chance of a transfusion reaction. If you or a relative donates blood, this is often done in anticipation of  surgery and is not appropriate for emergency situations. It takes many days to process the donated blood. RISKS AND COMPLICATIONS Although transfusion therapy is very safe and saves many lives, the main dangers of transfusion include:  Getting an infectious disease. Developing a transfusion reaction. This is an allergic reaction to something in the blood you were given. Every precaution is taken to prevent this. The decision to have a blood transfusion has been considered carefully by your caregiver before blood is given. Blood is not given unless the benefits outweigh the risks. AFTER THE TRANSFUSION Right after receiving a blood transfusion, you will usually feel much better and more energetic. This is especially true if your red blood cells have gotten low (anemic). The transfusion raises the level of the red blood cells which carry oxygen, and this usually causes an energy increase. The nurse administering the transfusion will monitor you carefully for complications. HOME CARE INSTRUCTIONS  No special instructions are needed after a transfusion. You may find your energy is better. Speak with your caregiver about any limitations on activity for underlying diseases you may  have. SEEK MEDICAL CARE IF:  Your condition is not improving after your transfusion. You develop redness or irritation at the intravenous (IV) site. SEEK IMMEDIATE MEDICAL CARE IF:  Any of the following symptoms occur over the next 12 hours: Shaking chills. You have a temperature by mouth above 102 F (38.9 C), not controlled by medicine. Chest, back, or muscle pain. People around you feel you are not acting correctly or are confused. Shortness of breath or difficulty breathing. Dizziness and fainting. You get a rash or develop hives. You have a decrease in urine output. Your urine turns a dark color or changes to pink, red, or brown. Any of the following symptoms occur over the next 10 days: You have a temperature by mouth above 102 F (38.9 C), not controlled by medicine. Shortness of breath. Weakness after normal activity. The white part of the eye turns yellow (jaundice). You have a decrease in the amount of urine or are urinating less often. Your urine turns a dark color or changes to pink, red, or brown. Document Released: 01/25/2000 Document Revised: 04/21/2011 Document Reviewed: 09/13/2007 [facility] Patient Information 2014 [place], [place].  _______________________________________________________________________

## 2023-06-08 ENCOUNTER — Other Ambulatory Visit: Payer: Self-pay

## 2023-06-08 ENCOUNTER — Encounter (HOSPITAL_COMMUNITY): Payer: Self-pay

## 2023-06-08 ENCOUNTER — Encounter (HOSPITAL_COMMUNITY)
Admission: RE | Admit: 2023-06-08 | Discharge: 2023-06-08 | Disposition: A | Discharge status: Home / Self Care | Source: Ambulatory Visit | Attending: Orthopedic Surgery | Admitting: Orthopedic Surgery

## 2023-06-08 VITALS — BP 139/92 | HR 64 | Resp 18 | Ht 74.0 in | Wt 210.0 lb

## 2023-06-08 DIAGNOSIS — Z01818 Encounter for other preprocedural examination: Secondary | ICD-10-CM

## 2023-06-08 DIAGNOSIS — Z86718 Personal history of other venous thrombosis and embolism: Secondary | ICD-10-CM | POA: Diagnosis not present

## 2023-06-08 DIAGNOSIS — M1612 Unilateral primary osteoarthritis, left hip: Secondary | ICD-10-CM | POA: Diagnosis not present

## 2023-06-08 DIAGNOSIS — I10 Essential (primary) hypertension: Secondary | ICD-10-CM

## 2023-06-08 HISTORY — DX: Ventricular premature depolarization: I49.3

## 2023-06-08 HISTORY — DX: Personal history of other venous thrombosis and embolism: Z86.718

## 2023-06-08 LAB — BASIC METABOLIC PANEL WITH GFR
Anion gap: 9 (ref 5–15)
BUN: 16 mg/dL (ref 8–23)
CO2: 25 mmol/L (ref 22–32)
Calcium: 9.2 mg/dL (ref 8.9–10.3)
Chloride: 104 mmol/L (ref 98–111)
Creatinine, Ser: 0.92 mg/dL (ref 0.61–1.24)
GFR, Estimated: >60 mL/min (ref >60)
Glucose, Bld: 95 mg/dL (ref 70–99)
Potassium: 4.1 mmol/L (ref 3.5–5.1)
Sodium: 138 mmol/L (ref 135–145)

## 2023-06-08 LAB — CBC
HCT: 38.1 % — ABNORMAL LOW (ref 39.0–52.0)
Hemoglobin: 12.9 g/dL — ABNORMAL LOW (ref 13.0–17.0)
MCH: 32.5 pg (ref 26.0–34.0)
MCHC: 33.9 g/dL (ref 30.0–36.0)
MCV: 96 fL (ref 80.0–100.0)
Platelets: 163 10*3/uL (ref 150–400)
RBC: 3.97 MIL/uL — ABNORMAL LOW (ref 4.22–5.81)
RDW: 12.7 % (ref 11.5–15.5)
WBC: 6.8 10*3/uL (ref 4.0–10.5)
nRBC: 0 % (ref 0.0–0.2)

## 2023-06-08 LAB — SURGICAL PCR SCREEN
MRSA, PCR: NEGATIVE
Staphylococcus aureus: NEGATIVE

## 2023-06-09 ENCOUNTER — Other Ambulatory Visit (HOSPITAL_COMMUNITY)

## 2023-06-09 NOTE — Progress Notes (Signed)
 Anesthesia Chart Review   Case: [id number] Date/Time: 06/16/23 1100   Procedure: ARTHROPLASTY, HIP, TOTAL, ANTERIOR APPROACH (Left: Hip)   Anesthesia type: Spinal   Diagnosis: Primary osteoarthritis of left hip [M16.12]   Pre-op diagnosis: Left hip osteoarthritis   Location: [facility] ROOM 10 / [facility]   Surgeons: Claiborne Crew, MD       DISCUSSION:78 y.o. never smoker with h/o HTN, PVC's, sinus bradycardia, mild AS, mild to moderate mitral valve regurgitation, left hip OA scheduled for above procedure 06/16/2023 with Dr. Claiborne Crew.   Per cardiology preoperative evaluation 06/04/23, "No concerns from a cardiac perspective.  The patient is at low risk to proceed without further work up.  If the patient has new chest pain or SOB prior to surgery, they should be revaluated."  VS: BP (!) 139/92   Pulse 64   Resp 18   Ht 6\' 2"  (1.88 m)   Wt 95.3 kg   SpO2 99%   BMI 26.96 kg/m   PROVIDERS: Barnetta Liberty, MD is PCP   Electrophysiologist: Will Cortland Ding, MD  LABS: Labs reviewed: Acceptable for surgery. (all labs ordered are listed, but only abnormal results are displayed)  Labs Reviewed  CBC - Abnormal; Notable for the following components:      Result Value   RBC 3.97 (*)    Hemoglobin 12.9 (*)    HCT 38.1 (*)    All other components within normal limits  SURGICAL PCR SCREEN  BASIC METABOLIC PANEL WITH GFR  TYPE AND SCREEN     IMAGES:   EKG:   CV: Echo 11/20/2021 1. Left ventricular ejection fraction, by estimation, is 60 to 65%. The  left ventricle has normal function. The left ventricle has no regional  wall motion abnormalities. There is mild left ventricular hypertrophy.  Left ventricular diastolic parameters  are consistent with Grade I diastolic dysfunction (impaired relaxation).  The average left ventricular global longitudinal strain is -17.6 %.   2. Right ventricular systolic function is normal. The right ventricular  size is moderately enlarged.   3.  Left atrial size was severely dilated.   4. Right atrial size was mildly dilated.   5. PISA radius 0.5 cm. The mitral valve is normal in structure. Mild to  moderate mitral valve regurgitation. No evidence of mitral stenosis.   6. The aortic valve is calcified. There is moderate calcification of the  aortic valve. There is moderate thickening of the aortic valve. Aortic  valve regurgitation is mild. Mild aortic valve stenosis. Aortic valve  area, by VTI measures 1.80 cm. Aortic  valve mean gradient measures 12.0 mmHg. Aortic valve Vmax measures 2.37  m/s.   7. Aortic dilatation noted. There is mild dilatation of the ascending  aorta, measuring 40 mm.   8. The inferior vena cava is normal in size with greater than 50%  respiratory variability, suggesting right atrial pressure of 3 mmHg.  Past Medical History:  Diagnosis Date   Abnormal EKG    Arthritis    Bradycardia    Dizziness    GERD (gastroesophageal reflux disease)    Hx of blood clots    post collar bone fracture, took anticoags for 65mo after   Hyperlipidemia    Hypertension    PVC's (premature ventricular contractions)     Past Surgical History:  Procedure Laterality Date   CLOSED REDUCTION HAND FRACTURE Right 1961   COLONOSCOPY  10/2015   jacobs hx polyps   FRACTURE SURGERY Left 2001   shoulder  TONSILLECTOMY  10/1949   TOTAL KNEE ARTHROPLASTY Left 06/24/2022   Procedure: TOTAL KNEE ARTHROPLASTY;  Surgeon: Claiborne Crew, MD;  Location: [facility];  Service: Orthopedics;  Laterality: Left;    MEDICATIONS:  aspirin  EC 81 MG tablet   atorvastatin  (LIPITOR) 10 MG tablet   flecainide  (TAMBOCOR ) 50 MG tablet   hydrochlorothiazide  (MICROZIDE ) 12.5 MG capsule   irbesartan  (AVAPRO ) 300 MG tablet   Multiple Vitamin (MULTIVITAMIN WITH MINERALS) TABS tablet   naproxen sodium (ALEVE) 220 MG tablet   Omega-3 Fatty Acids (FISH OIL PO)   sildenafil (VIAGRA) 100 MG tablet   No current facility-administered medications for this  encounter.     Chick Cotton Ward, PA-C [facility] [phone]

## 2023-06-16 ENCOUNTER — Encounter (HOSPITAL_COMMUNITY)
Admission: RE | Disposition: A | Discharge status: Home / Self Care | Payer: Self-pay | Source: Home / Self Care | Attending: Orthopedic Surgery

## 2023-06-16 ENCOUNTER — Observation Stay (HOSPITAL_COMMUNITY)
Admission: RE | Admit: 2023-06-16 | Discharge: 2023-06-17 | Disposition: A | Discharge status: Home / Self Care | Attending: Orthopedic Surgery | Admitting: Orthopedic Surgery

## 2023-06-16 ENCOUNTER — Encounter (HOSPITAL_COMMUNITY): Payer: Self-pay | Admitting: Orthopedic Surgery

## 2023-06-16 ENCOUNTER — Other Ambulatory Visit: Payer: Self-pay

## 2023-06-16 ENCOUNTER — Ambulatory Visit (HOSPITAL_COMMUNITY): Payer: Self-pay | Admitting: Physician Assistant

## 2023-06-16 ENCOUNTER — Observation Stay (HOSPITAL_COMMUNITY)

## 2023-06-16 ENCOUNTER — Ambulatory Visit (HOSPITAL_COMMUNITY)

## 2023-06-16 ENCOUNTER — Ambulatory Visit (HOSPITAL_COMMUNITY): Admitting: Certified Registered"

## 2023-06-16 DIAGNOSIS — M1612 Unilateral primary osteoarthritis, left hip: Secondary | ICD-10-CM

## 2023-06-16 DIAGNOSIS — I10 Essential (primary) hypertension: Secondary | ICD-10-CM | POA: Diagnosis not present

## 2023-06-16 DIAGNOSIS — Z79899 Other long term (current) drug therapy: Secondary | ICD-10-CM | POA: Diagnosis not present

## 2023-06-16 DIAGNOSIS — Z7982 Long term (current) use of aspirin: Secondary | ICD-10-CM | POA: Diagnosis not present

## 2023-06-16 DIAGNOSIS — Z96652 Presence of left artificial knee joint: Secondary | ICD-10-CM | POA: Insufficient documentation

## 2023-06-16 DIAGNOSIS — Z96642 Presence of left artificial hip joint: Secondary | ICD-10-CM

## 2023-06-16 DIAGNOSIS — Z471 Aftercare following joint replacement surgery: Secondary | ICD-10-CM | POA: Diagnosis not present

## 2023-06-16 HISTORY — PX: TOTAL HIP ARTHROPLASTY: SHX124

## 2023-06-16 LAB — ABO/RH: ABO/RH(D): O NEG

## 2023-06-16 LAB — TYPE AND SCREEN
ABO/RH(D): O NEG
Antibody Screen: NEGATIVE

## 2023-06-16 SURGERY — ARTHROPLASTY, HIP, TOTAL, ANTERIOR APPROACH
Anesthesia: Spinal | Site: Hip | Laterality: Left

## 2023-06-16 MED ORDER — DEXAMETHASONE SODIUM PHOSPHATE 10 MG/ML IJ SOLN
10.0000 mg | Freq: Once | INTRAMUSCULAR | Status: AC
  Administered 2023-06-17: 10 mg via INTRAVENOUS
  Filled 2023-06-16: qty 1

## 2023-06-16 MED ORDER — SODIUM CHLORIDE 0.9% FLUSH: 3.0000 mL | INTRAVENOUS | Status: DC | PRN

## 2023-06-16 MED ORDER — EPHEDRINE 5 MG/ML INJ
INTRAVENOUS | Status: AC
  Filled 2023-06-16: qty 5

## 2023-06-16 MED ORDER — PHENOL 1.4 % MT LIQD: 1.0000 | OROMUCOSAL | Status: DC | PRN

## 2023-06-16 MED ORDER — METHOCARBAMOL 500 MG PO TABS
500.0000 mg | ORAL_TABLET | Freq: Four times a day (QID) | ORAL | Status: DC | PRN
  Administered 2023-06-16: 500 mg via ORAL
  Filled 2023-06-16: qty 1

## 2023-06-16 MED ORDER — CEFAZOLIN SODIUM-DEXTROSE 2-4 GM/100ML-% IV SOLN
2.0000 g | INTRAVENOUS | Status: AC
  Administered 2023-06-16: 2 g via INTRAVENOUS
  Filled 2023-06-16: qty 100

## 2023-06-16 MED ORDER — SENNA 8.6 MG PO TABS
2.0000 | ORAL_TABLET | Freq: Every day | ORAL | Status: DC
Start: 2023-06-16 — End: 2023-06-17
  Administered 2023-06-16: 17.2 mg via ORAL
  Filled 2023-06-16: qty 2

## 2023-06-16 MED ORDER — KETOROLAC TROMETHAMINE 30 MG/ML IJ SOLN
INTRAMUSCULAR | Status: AC
  Filled 2023-06-16: qty 1

## 2023-06-16 MED ORDER — ALUM & MAG HYDROXIDE-SIMETH 200-200-20 MG/5ML PO SUSP: 30.0000 mL | ORAL | Status: DC | PRN

## 2023-06-16 MED ORDER — METOCLOPRAMIDE HCL 5 MG/ML IJ SOLN: 5.0000 mg | Freq: Three times a day (TID) | INTRAMUSCULAR | Status: DC | PRN

## 2023-06-16 MED ORDER — SODIUM CHLORIDE (PF) 0.9 % IJ SOLN
INTRAMUSCULAR | Status: DC | PRN
  Administered 2023-06-16: 61 mL

## 2023-06-16 MED ORDER — RIVAROXABAN 10 MG PO TABS
10.0000 mg | ORAL_TABLET | Freq: Every day | ORAL | Status: DC
  Administered 2023-06-17: 10 mg via ORAL
  Filled 2023-06-16: qty 1

## 2023-06-16 MED ORDER — EPHEDRINE SULFATE-NACL 50-0.9 MG/10ML-% IV SOSY
PREFILLED_SYRINGE | INTRAVENOUS | Status: DC | PRN
  Administered 2023-06-16 (×2): 5 mg via INTRAVENOUS
  Administered 2023-06-16: 10 mg via INTRAVENOUS
  Administered 2023-06-16: 5 mg via INTRAVENOUS
  Administered 2023-06-16: 10 mg via INTRAVENOUS

## 2023-06-16 MED ORDER — PROPOFOL 10 MG/ML IV BOLUS
INTRAVENOUS | Status: DC | PRN
  Administered 2023-06-16: 20 mg via INTRAVENOUS
  Administered 2023-06-16: 30 mg via INTRAVENOUS

## 2023-06-16 MED ORDER — HYDROCHLOROTHIAZIDE 12.5 MG PO TABS
12.5000 mg | ORAL_TABLET | Freq: Every day | ORAL | Status: DC
  Administered 2023-06-17: 12.5 mg via ORAL
  Filled 2023-06-16 (×2): qty 1

## 2023-06-16 MED ORDER — METHOCARBAMOL 1000 MG/10ML IJ SOLN: 500.0000 mg | Freq: Four times a day (QID) | INTRAMUSCULAR | Status: DC | PRN

## 2023-06-16 MED ORDER — TRANEXAMIC ACID-NACL 1000-0.7 MG/100ML-% IV SOLN
1000.0000 mg | Freq: Once | INTRAVENOUS | Status: AC
  Administered 2023-06-16: 1000 mg via INTRAVENOUS
  Filled 2023-06-16: qty 100

## 2023-06-16 MED ORDER — DEXAMETHASONE SODIUM PHOSPHATE 10 MG/ML IJ SOLN
8.0000 mg | Freq: Once | INTRAMUSCULAR | Status: AC
  Administered 2023-06-16: 8 mg via INTRAVENOUS

## 2023-06-16 MED ORDER — OXYCODONE HCL 5 MG PO TABS
5.0000 mg | ORAL_TABLET | ORAL | Status: DC | PRN
  Administered 2023-06-16 – 2023-06-17 (×4): 5 mg via ORAL
  Filled 2023-06-16 (×4): qty 1

## 2023-06-16 MED ORDER — CEFAZOLIN SODIUM-DEXTROSE 2-4 GM/100ML-% IV SOLN
2.0000 g | Freq: Four times a day (QID) | INTRAVENOUS | Status: AC
  Administered 2023-06-16 (×2): 2 g via INTRAVENOUS
  Filled 2023-06-16 (×2): qty 100

## 2023-06-16 MED ORDER — HYDROMORPHONE HCL 1 MG/ML IJ SOLN: 0.5000 mg | INTRAMUSCULAR | Status: DC | PRN

## 2023-06-16 MED ORDER — ONDANSETRON HCL 4 MG/2ML IJ SOLN
INTRAMUSCULAR | Status: DC | PRN
  Administered 2023-06-16: 4 mg via INTRAVENOUS

## 2023-06-16 MED ORDER — OXYCODONE HCL 5 MG PO TABS: 10.0000 mg | ORAL_TABLET | ORAL | Status: DC | PRN

## 2023-06-16 MED ORDER — IRBESARTAN 150 MG PO TABS
300.0000 mg | ORAL_TABLET | Freq: Every day | ORAL | Status: DC
  Administered 2023-06-17: 300 mg via ORAL
  Filled 2023-06-16: qty 2

## 2023-06-16 MED ORDER — PROPOFOL 1000 MG/100ML IV EMUL
INTRAVENOUS | Status: AC
  Filled 2023-06-16: qty 100

## 2023-06-16 MED ORDER — ATORVASTATIN CALCIUM 10 MG PO TABS
10.0000 mg | ORAL_TABLET | Freq: Every day | ORAL | Status: DC
  Administered 2023-06-17: 10 mg via ORAL
  Filled 2023-06-16: qty 1

## 2023-06-16 MED ORDER — 0.9 % SODIUM CHLORIDE (POUR BTL) OPTIME
TOPICAL | Status: DC | PRN
  Administered 2023-06-16: 1000 mL

## 2023-06-16 MED ORDER — POLYETHYLENE GLYCOL 3350 17 G PO PACK
17.0000 g | PACK | Freq: Two times a day (BID) | ORAL | Status: DC
Start: 2023-06-16 — End: 2023-06-17
  Administered 2023-06-17: 17 g via ORAL
  Filled 2023-06-16: qty 1

## 2023-06-16 MED ORDER — SODIUM CHLORIDE (PF) 0.9 % IJ SOLN
INTRAMUSCULAR | Status: AC
  Filled 2023-06-16: qty 30

## 2023-06-16 MED ORDER — ORAL CARE MOUTH RINSE: 15.0000 mL | Freq: Once | OROMUCOSAL | Status: AC

## 2023-06-16 MED ORDER — MENTHOL 3 MG MT LOZG: 1.0000 | LOZENGE | OROMUCOSAL | Status: DC | PRN

## 2023-06-16 MED ORDER — LACTATED RINGERS IV SOLN: INTRAVENOUS | Status: DC

## 2023-06-16 MED ORDER — SODIUM CHLORIDE 0.9% FLUSH
3.0000 mL | Freq: Two times a day (BID) | INTRAVENOUS | Status: DC
  Administered 2023-06-17: 4 mL via INTRAVENOUS

## 2023-06-16 MED ORDER — TRANEXAMIC ACID-NACL 1000-0.7 MG/100ML-% IV SOLN
1000.0000 mg | INTRAVENOUS | Status: AC
  Administered 2023-06-16: 1000 mg via INTRAVENOUS
  Filled 2023-06-16: qty 100

## 2023-06-16 MED ORDER — CHLORHEXIDINE GLUCONATE 0.12 % MT SOLN
15.0000 mL | Freq: Once | OROMUCOSAL | Status: AC
  Administered 2023-06-16: 15 mL via OROMUCOSAL

## 2023-06-16 MED ORDER — FENTANYL CITRATE (PF) 100 MCG/2ML IJ SOLN
INTRAMUSCULAR | Status: DC | PRN
  Administered 2023-06-16: 100 ug via INTRAVENOUS

## 2023-06-16 MED ORDER — ONDANSETRON HCL 4 MG/2ML IJ SOLN: 4.0000 mg | Freq: Four times a day (QID) | INTRAMUSCULAR | Status: DC | PRN

## 2023-06-16 MED ORDER — BUPIVACAINE-EPINEPHRINE (PF) 0.25% -1:200000 IJ SOLN
INTRAMUSCULAR | Status: AC
  Filled 2023-06-16: qty 30

## 2023-06-16 MED ORDER — BUPIVACAINE IN DEXTROSE 0.75-8.25 % IT SOLN
INTRATHECAL | Status: DC | PRN
  Administered 2023-06-16: 2 mL via INTRATHECAL

## 2023-06-16 MED ORDER — FENTANYL CITRATE PF 50 MCG/ML IJ SOSY: 25.0000 ug | PREFILLED_SYRINGE | INTRAMUSCULAR | Status: DC | PRN

## 2023-06-16 MED ORDER — POVIDONE-IODINE 10 % EX SWAB: 2.0000 | Freq: Once | CUTANEOUS | Status: DC

## 2023-06-16 MED ORDER — ACETAMINOPHEN 10 MG/ML IV SOLN: 1000.0000 mg | Freq: Once | INTRAVENOUS | Status: DC | PRN

## 2023-06-16 MED ORDER — FENTANYL CITRATE (PF) 100 MCG/2ML IJ SOLN
INTRAMUSCULAR | Status: AC
  Filled 2023-06-16: qty 2

## 2023-06-16 MED ORDER — ACETAMINOPHEN 500 MG PO TABS
1000.0000 mg | ORAL_TABLET | Freq: Four times a day (QID) | ORAL | Status: DC
  Administered 2023-06-16 – 2023-06-17 (×4): 1000 mg via ORAL
  Filled 2023-06-16 (×4): qty 2

## 2023-06-16 MED ORDER — ONDANSETRON HCL 4 MG PO TABS: 4.0000 mg | ORAL_TABLET | Freq: Four times a day (QID) | ORAL | Status: DC | PRN

## 2023-06-16 MED ORDER — DIPHENHYDRAMINE HCL 12.5 MG/5ML PO ELIX: 12.5000 mg | ORAL_SOLUTION | ORAL | Status: DC | PRN

## 2023-06-16 MED ORDER — PROPOFOL 500 MG/50ML IV EMUL
INTRAVENOUS | Status: DC | PRN
  Administered 2023-06-16: 100 ug/kg/min via INTRAVENOUS

## 2023-06-16 MED ORDER — STERILE WATER FOR IRRIGATION IR SOLN
Status: DC | PRN
  Administered 2023-06-16: 1000 mL

## 2023-06-16 MED ORDER — AMISULPRIDE (ANTIEMETIC) 5 MG/2ML IV SOLN: 10.0000 mg | Freq: Once | INTRAVENOUS | Status: DC | PRN

## 2023-06-16 MED ORDER — DEXAMETHASONE SODIUM PHOSPHATE 10 MG/ML IJ SOLN
INTRAMUSCULAR | Status: AC
  Filled 2023-06-16: qty 1

## 2023-06-16 MED ORDER — METOCLOPRAMIDE HCL 5 MG PO TABS: 5.0000 mg | ORAL_TABLET | Freq: Three times a day (TID) | ORAL | Status: DC | PRN

## 2023-06-16 MED ORDER — ONDANSETRON HCL 4 MG/2ML IJ SOLN
INTRAMUSCULAR | Status: AC
  Filled 2023-06-16: qty 2

## 2023-06-16 MED ORDER — BISACODYL 10 MG RE SUPP: 10.0000 mg | Freq: Every day | RECTAL | Status: DC | PRN

## 2023-06-16 MED ORDER — ONDANSETRON HCL 4 MG/2ML IJ SOLN: 4.0000 mg | Freq: Once | INTRAMUSCULAR | Status: DC | PRN

## 2023-06-16 MED ORDER — FLECAINIDE ACETATE 50 MG PO TABS
50.0000 mg | ORAL_TABLET | Freq: Two times a day (BID) | ORAL | Status: DC
  Administered 2023-06-16 – 2023-06-17 (×2): 50 mg via ORAL
  Filled 2023-06-16 (×3): qty 1

## 2023-06-16 SURGICAL SUPPLY — 37 items
BAG COUNTER SPONGE SURGICOUNT (BAG) IMPLANT
BAG ZIPLOCK 12X15 (MISCELLANEOUS) IMPLANT
BLADE SAG 18X100X1.27 (BLADE) ×1 IMPLANT
COVER PERINEAL POST (MISCELLANEOUS) ×1 IMPLANT
COVER SURGICAL LIGHT HANDLE (MISCELLANEOUS) ×1 IMPLANT
CUP ACET PINNCLE SECTR II 62MM (Hips) IMPLANT
DERMABOND ADVANCED .7 DNX12 (GAUZE/BANDAGES/DRESSINGS) ×1 IMPLANT
DRAPE FOOT SWITCH (DRAPES) ×1 IMPLANT
DRAPE STERI IOBAN 125X83 (DRAPES) ×1 IMPLANT
DRAPE U-SHAPE 47X51 STRL (DRAPES) ×2 IMPLANT
DRESSING AQUACEL AG SP 3.5X10 (GAUZE/BANDAGES/DRESSINGS) ×1 IMPLANT
DURAPREP 26ML APPLICATOR (WOUND CARE) ×1 IMPLANT
ELECT REM PT RETURN 15FT ADLT (MISCELLANEOUS) ×1 IMPLANT
GLOVE BIO SURGEON STRL SZ 6 (GLOVE) ×1 IMPLANT
GLOVE BIOGEL PI IND STRL 6.5 (GLOVE) ×1 IMPLANT
GLOVE BIOGEL PI IND STRL 7.5 (GLOVE) ×1 IMPLANT
GLOVE ORTHO TXT STRL SZ7.5 (GLOVE) ×2 IMPLANT
GOWN STRL REUS W/ TWL LRG LVL3 (GOWN DISPOSABLE) ×2 IMPLANT
HEAD CERAMIC DELTA 36 PLUS 1.5 (Hips) IMPLANT
HOLDER FOLEY CATH W/STRAP (MISCELLANEOUS) ×1 IMPLANT
KIT TURNOVER KIT A (KITS) IMPLANT
LINER NEUTRAL 62MMC36MM P4 (Liner) IMPLANT
MANIFOLD NEPTUNE II (INSTRUMENTS) ×1 IMPLANT
NDL SAFETY ECLIPSE 18X1.5 (NEEDLE) IMPLANT
PACK ANTERIOR HIP CUSTOM (KITS) ×1 IMPLANT
PENCIL SMOKE EVACUATOR (MISCELLANEOUS) ×1 IMPLANT
SCREW 6.5MMX30MM (Screw) IMPLANT
STEM FEMORAL SZ11 HIGH ACTIS (Stem) IMPLANT
SUT MNCRL AB 4-0 PS2 18 (SUTURE) ×1 IMPLANT
SUT VIC AB 1 CT1 36 (SUTURE) ×3 IMPLANT
SUT VIC AB 2-0 CT1 TAPERPNT 27 (SUTURE) ×2 IMPLANT
SUTURE STRATFX 0 PDS 27 VIOLET (SUTURE) ×1 IMPLANT
SYR 3ML LL SCALE MARK (SYRINGE) IMPLANT
TOWEL GREEN STERILE FF (TOWEL DISPOSABLE) ×1 IMPLANT
TRAY FOLEY MTR SLVR 16FR STAT (SET/KITS/TRAYS/PACK) ×1 IMPLANT
TUBE SUCTION HIGH CAP CLEAR NV (SUCTIONS) ×1 IMPLANT
WATER STERILE IRR 1000ML POUR (IV SOLUTION) ×1 IMPLANT

## 2023-06-16 NOTE — Care Plan (Signed)
 Ortho Bundle Case Management Note  Patient Details  Name: ADHVAITH THOUSAND MRN: [id number] Date of Birth: 1945/08/28                  L THA on 06/16/23. DCP: Home with wife. DME: No needs. Has RW and cane. PT: [facility]   DME Arranged:  N/A DME Agency:     HH Arranged:    HH Agency:     Additional Comments: Please contact me with any questions of if this plan should need to change.    Bronwen Canon, CCM Certified Case Manager, [place] [phone] 06/16/2023, 11:15 AM

## 2023-06-16 NOTE — Plan of Care (Signed)

## 2023-06-16 NOTE — Anesthesia Postprocedure Evaluation (Signed)
 Anesthesia Post Note  Patient: Arlene S Hancher  Procedure(s) Performed: ARTHROPLASTY, HIP, TOTAL, ANTERIOR APPROACH (Left: Hip)     Patient location during evaluation: PACU Anesthesia Type: Spinal Level of consciousness: awake Pain management: pain level controlled Vital Signs Assessment: post-procedure vital signs reviewed and stable Respiratory status: spontaneous breathing, nonlabored ventilation and respiratory function stable Cardiovascular status: blood pressure returned to baseline and stable Postop Assessment: no apparent nausea or vomiting Anesthetic complications: no   No notable events documented.  Last Vitals:  Vitals:   06/16/23 1515 06/16/23 1544  BP: 138/63 (!) 151/91  Pulse: 62 64  Resp: 14 16  Temp: (!) 36.4 C (!) 36.4 C  SpO2: 99% 98%    Last Pain:  Vitals:   06/16/23 1515  TempSrc:   PainSc: 0-No pain                 Muriel Wilber P Kyshawn Teal

## 2023-06-16 NOTE — Transfer of Care (Signed)
 Immediate Anesthesia Transfer of Care Note  Patient: Stephen Roy  Procedure(s) Performed: ARTHROPLASTY, HIP, TOTAL, ANTERIOR APPROACH (Left: Hip)  Patient Location: PACU  Anesthesia Type:Spinal  Level of Consciousness: awake, alert , and oriented  Airway & Oxygen Therapy: Patient Spontanous Breathing and Patient connected to face mask oxygen  Post-op Assessment: Report given to RN and Post -op Vital signs reviewed and stable  Post vital signs: Reviewed and stable  Last Vitals:  Vitals Value Taken Time  BP 127/110 06/16/23 1213  Temp    Pulse 59 06/16/23 1215  Resp 15 06/16/23 1215  SpO2 100 % 06/16/23 1215  Vitals shown include unfiled device data.  Last Pain:  Vitals:   06/16/23 0851  TempSrc:   PainSc: 0-No pain         Complications: No notable events documented.

## 2023-06-16 NOTE — Anesthesia Preprocedure Evaluation (Addendum)
 Anesthesia Evaluation  Patient identified by MRN, date of birth, ID band Patient awake    Reviewed: Allergy & Precautions, NPO status , Patient's Chart, lab work & pertinent test results  Airway Mallampati: II  TM Distance: >3 FB Neck ROM: Full    Dental no notable dental hx.    Pulmonary neg pulmonary ROS   Pulmonary exam normal        Cardiovascular hypertension, Pt. on medications Normal cardiovascular exam  PVC's   Neuro/Psych negative neurological ROS  negative psych ROS   GI/Hepatic negative GI ROS,,,(+)     substance abuse    Endo/Other  negative endocrine ROS    Renal/GU negative Renal ROS     Musculoskeletal  (+) Arthritis ,    Abdominal   Peds  Hematology  (+) Blood dyscrasia, anemia   Anesthesia Other Findings Left hip osteoarthritis  Reproductive/Obstetrics                             Anesthesia Physical Anesthesia Plan  ASA: 2  Anesthesia Plan: Spinal   Post-op Pain Management:    Induction: Intravenous  PONV Risk Score and Plan: 1 and Ondansetron , Dexamethasone , Propofol  infusion and Treatment may vary due to age or medical condition  Airway Management Planned: Simple Face Mask  Additional Equipment:   Intra-op Plan:   Post-operative Plan:   Informed Consent: I have reviewed the patients History and Physical, chart, labs and discussed the procedure including the risks, benefits and alternatives for the proposed anesthesia with the patient or authorized representative who has indicated his/her understanding and acceptance.     Dental advisory given  Plan Discussed with: CRNA  Anesthesia Plan Comments:        Anesthesia Quick Evaluation

## 2023-06-16 NOTE — Anesthesia Procedure Notes (Signed)
 Spinal  Patient location during procedure: OR End time: 06/16/2023 10:57 AM Reason for block: surgical anesthesia Staffing Performed: resident/CRNA  Resident/CRNA: Maxene Span D, CRNA Performed by: Deloris Moger, Clinical cytogeneticist D, CRNA Authorized by: Peggi Bowels, MD   Preanesthetic Checklist Completed: patient identified, IV checked, site marked, risks and benefits discussed, surgical consent, monitors and equipment checked, pre-op evaluation and timeout performed Spinal Block Patient position: sitting Prep: DuraPrep Patient monitoring: heart rate, continuous pulse ox and blood pressure Approach: right paramedian Location: L3-4 Injection technique: single-shot Needle Needle type: Pencan  Needle gauge: 24 G Needle length: 9 cm Assessment Sensory level: T6 Events: CSF return

## 2023-06-16 NOTE — H&P (Signed)
 TOTAL HIP ADMISSION H&P  Patient is admitted for left total hip arthroplasty.  Therapy Plans: HEP Disposition: Home with wife Planned DVT Prophylaxis: Xarelto  10mg  daily DME needed: none PCP: Dr. Jolee Naval - clearance & labs received TXA: IV Allergies: lisinopril , simvastatin Anesthesia Concerns: none BMI: 27.3 Last HgbA1c: Not diabetic   Other: - oxycodone , robaxin , tylenol , celebrex (send ahead) - Hx of UE blood clot related to clavicle fracture which led to PE - [place] - Has been getting bilateral hip IA with Dr. Rexanne Catalina for years - staying overnight   Subjective:  Chief Complaint: left hip pain  HPI: Stephen Roy, 78 y.o. male, has a history of pain and functional disability in the left hip(s) due to arthritis and patient has failed non-surgical conservative treatments for greater than 12 weeks to include NSAID's and/or analgesics, corticosteriod injections, and activity modification.  Onset of symptoms was gradual starting 3 years ago with gradually worsening course since that time.The patient noted no past surgery on the left hip(s).  Patient currently rates pain in the left hip at 8 out of 10 with activity. Patient has worsening of pain with activity and weight bearing and pain that interfers with activities of daily living. Patient has evidence of joint space narrowing by imaging studies. This condition presents safety issues increasing the risk of falls.  There is no current active infection.  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   Hypertension    Hyperlipidemia    Dizziness    Bradycardia    Abnormal EKG    Past Medical History:  Diagnosis Date   Abnormal EKG    Arthritis    Bradycardia    Dizziness    GERD (gastroesophageal reflux disease)    Hx of blood clots    post collar bone fracture, took anticoags for 3mo after   Hyperlipidemia    Hypertension    PVC's  (premature ventricular contractions)     Past Surgical History:  Procedure Laterality Date   CLOSED REDUCTION HAND FRACTURE Right 1961   COLONOSCOPY  10/2015   jacobs hx polyps   FRACTURE SURGERY Left 2001   shoulder   TONSILLECTOMY  10/1949   TOTAL KNEE ARTHROPLASTY Left 06/24/2022   Procedure: TOTAL KNEE ARTHROPLASTY;  Surgeon: Claiborne Crew, MD;  Location: [facility];  Service: Orthopedics;  Laterality: Left;    No current facility-administered medications for this encounter.   Current Outpatient Medications  Medication Sig Dispense Refill Last Dose/Taking   aspirin  EC 81 MG tablet Take 81 mg by mouth daily. Swallow whole.   Taking   atorvastatin  (LIPITOR) 10 MG tablet Take 10 mg by mouth in the morning.   Taking   flecainide  (TAMBOCOR ) 50 MG tablet Take 1 tablet (50 mg total) by mouth 2 (two) times daily. 180 tablet 3 Taking   hydrochlorothiazide  (MICROZIDE ) 12.5 MG capsule Take 12.5 mg by mouth in the morning.   Taking   irbesartan  (AVAPRO ) 300 MG tablet Take 1 tablet (300 mg total) by mouth daily. 90 tablet 2 Taking   Multiple Vitamin (MULTIVITAMIN WITH MINERALS) TABS tablet Take 1 tablet by mouth daily. Multivitamin for Men   Taking   naproxen sodium (ALEVE) 220 MG tablet Take 220-440 mg by mouth 2 (two) times daily as needed (pain.).   Taking As Needed   Omega-3 Fatty Acids (FISH OIL PO) Take 1 capsule by mouth in the  morning.   Taking   sildenafil (VIAGRA) 100 MG tablet Take 100 mg by mouth daily as needed for erectile dysfunction.   Taking As Needed   Allergies  Allergen Reactions   Lisinopril Other (See Comments)    impotence   Simvastatin Other (See Comments)    myalgia    Social History   Tobacco Use   Smoking status: Never   Smokeless tobacco: Never  Substance Use Topics   Alcohol  use: Yes    Alcohol /week: 14.0 standard drinks of alcohol     Types: 14 Standard drinks or equivalent per week    Family History  Problem Relation Age of Onset   Heart disease Father     Colon cancer Neg Hx    Rectal cancer Neg Hx    Stomach cancer Neg Hx    Esophageal cancer Neg Hx      Review of Systems  Constitutional:  Negative for chills and fever.  Respiratory:  Negative for cough and shortness of breath.   Cardiovascular:  Negative for chest pain.  Gastrointestinal:  Negative for nausea and vomiting.  Musculoskeletal:  Positive for arthralgias.     Objective:  Physical Exam Well nourished and well developed. General: Alert and oriented x3, cooperative and pleasant, no acute distress.  Musculoskeletal: Bilateral hip exams: Despite his radiographic findings of arthritis he has maintained pretty good motion in his hips. He is noted to have hip flexion internal rotation close to 20 degrees with external rotation to 30 degrees No significant external rotation contracture noted with active hip flexion Neurovascular intact distally  Vital signs in last 24 hours:    Labs:   Estimated body mass index is 26.96 kg/m as calculated from the following:   Height as of 06/08/23: 6\' 2"  (1.88 m).   Weight as of 06/08/23: 95.3 kg.   Imaging Review Plain radiographs demonstrate severe degenerative joint disease of the left hip(s). The bone quality appears to be adequate for age and reported activity level.      Assessment/Plan:  End stage arthritis, left hip(s)  The patient history, physical examination, clinical judgement of the provider and imaging studies are consistent with end stage degenerative joint disease of the left hip(s) and total hip arthroplasty is deemed medically necessary. The treatment options including medical management, injection therapy, arthroscopy and arthroplasty were discussed at length. The risks and benefits of total hip arthroplasty were presented and reviewed. The risks due to aseptic loosening, infection, stiffness, dislocation/subluxation,  thromboembolic complications and other imponderables were discussed.  The patient  acknowledged the explanation, agreed to proceed with the plan and consent was signed. Patient is being admitted for inpatient treatment for surgery, pain control, PT, OT, prophylactic antibiotics, VTE prophylaxis, progressive ambulation and ADL's and discharge planning.The patient is planning to be discharged  home.   Kim Pen, PA-C Orthopedic Surgery [facility] [place] [phone]

## 2023-06-16 NOTE — Discharge Instructions (Addendum)

## 2023-06-16 NOTE — Interval H&P Note (Signed)
 History and Physical Interval Note:  06/16/2023 9:34 AM  Stephen Roy  has presented today for surgery, with the diagnosis of Left hip osteoarthritis.  The various methods of treatment have been discussed with the patient and family. After consideration of risks, benefits and other options for treatment, the patient has consented to  Procedure(s): ARTHROPLASTY, HIP, TOTAL, ANTERIOR APPROACH (Left) as a surgical intervention.  The patient's history has been reviewed, patient examined, no change in status, stable for surgery.  I have reviewed the patient's chart and labs.  Questions were answered to the patient's satisfaction.     [name]

## 2023-06-16 NOTE — Op Note (Signed)
 NAME:  Stephen Roy                ACCOUNT NO.: [id number]      MEDICAL RECORD NO.: [id number]      FACILITY:  [facility]      PHYSICIAN:  [name]  DATE OF BIRTH:  03-15-1945     DATE OF PROCEDURE:  06/16/2023                                 OPERATIVE REPORT         PREOPERATIVE DIAGNOSIS: Left  hip osteoarthritis.      POSTOPERATIVE DIAGNOSIS:  Left hip osteoarthritis.      PROCEDURE:  Left total hip replacement through an anterior approach   utilizing DePuy THR system, component size 62 mm pinnacle cup, a size 36+4 neutral   Altrex liner, a size 11 Hi Actis stem with a 36+1.5 delta ceramic   ball.      SURGEON:  Azalea Lento. Bernard Brick, M.D.      ASSISTANT:  Kim Pen, PA-C     ANESTHESIA:  Spinal.      SPECIMENS:  None.      COMPLICATIONS:  None.      BLOOD LOSS:  300 cc     DRAINS:  None.      INDICATION OF THE PROCEDURE:  Stephen Roy is a 78 y.o. male who had   presented to office for evaluation of left hip pain.  Radiographs revealed   progressive degenerative changes with bone-on-bone   articulation of the  hip joint, including subchondral cystic changes and osteophytes.  The patient had painful limited range of   motion significantly affecting their overall quality of life and function.  The patient was failing to    respond to conservative measures including medications and/or injections and activity modification and at this point was ready   to proceed with more definitive measures.  Consent was obtained for   benefit of pain relief.  Specific risks of infection, DVT, component   failure, dislocation, neurovascular injury, and need for revision surgery were reviewed in the office.     PROCEDURE IN DETAIL:  The patient was brought to operative theater.   Once adequate anesthesia, preoperative antibiotics, 2 gm of Ancef , 1 gm of Tranexamic Acid , and 10 mg of Decadron  were administered, the patient was positioned supine on the [employer] table.  Once the patient was safely positioned with adequate padding of boney prominences we predraped out the hip, and used fluoroscopy to confirm orientation of the pelvis.      The left hip was then prepped and draped from proximal iliac crest to   mid thigh with a shower curtain technique.      Time-out was performed identifying the patient, planned procedure, and the appropriate extremity.     An incision was then made 2 cm lateral to the   anterior superior iliac spine extending over the orientation of the   tensor fascia lata muscle and sharp dissection was carried down to the   fascia of the muscle.      The fascia was then incised.  The muscle belly was identified and swept   laterally and retractor placed along the superior neck.  Following   cauterization of the circumflex vessels and removing some pericapsular   fat, a second cobra retractor was placed on the inferior  neck.  A T-capsulotomy was made along the line of the   superior neck to the trochanteric fossa, then extended proximally and   distally.  Tag sutures were placed and the retractors were then placed   intracapsular.  We then identified the trochanteric fossa and   orientation of my neck cut and then made a neck osteotomy with the femur on traction.  The femoral   head was removed without difficulty or complication.  Traction was let   off and retractors were placed posterior and anterior around the   acetabulum.      The labrum and foveal tissue were debrided.  I began reaming with a 57 mm   reamer and reamed up to 61 mm reamer with good bony bed preparation and a 62 mm  cup was chosen.  The final 62 mm Pinnacle cup was then impacted under fluoroscopy to confirm the depth of penetration and orientation with respect to   Abduction and forward flexion.  A screw was placed into the ilium followed by the hole eliminator.  The final   36+4 neutral Altrex liner was impacted with good visualized rim fit.  The cup  was positioned anatomically within the acetabular portion of the pelvis.      At this point, the femur was rolled to 100 degrees.  Further capsule was   released off the inferior aspect of the femoral neck.  I then   released the superior capsule proximally.  With the leg in a neutral position the hook was placed laterally   along the femur under the vastus lateralis origin and elevated manually and then held in position using the hook attachment on the bed.  The leg was then extended and adducted with the leg rolled to 100   degrees of external rotation.  Retractors were placed along the medial calcar and posteriorly over the greater trochanter.  Once the proximal femur was fully   exposed, I used a box osteotome to set orientation.  I then began   broaching with the starting chili pepper broach and passed this by hand and then broached up to 11.  With the 11 broach in place I chose a high offset neck and did several trial reductions.  The offset was appropriate, leg lengths   appeared to be equal best matched with the +1.5 head ball trial confirmed radiographically.   Given these findings, I went ahead and dislocated the hip, repositioned all   retractors and positioned the right hip in the extended and abducted position.  The final 11 Hi Actis stem was   chosen and it was impacted down to the level of neck cut.  Based on this   and the trial reductions, a final 36+1.5 delta ceramic ball was chosen and   impacted onto a clean and dry trunnion, and the hip was reduced.  The   hip had been irrigated throughout the case again at this point.  I did   reapproximate the superior capsular leaflet to the anterior leaflet   using #1 Vicryl.  The fascia of the   tensor fascia lata muscle was then reapproximated using #1 Vicryl and #0 Stratafix sutures.  The   remaining wound was closed with 2-0 Vicryl and running 4-0 Monocryl.   The hip was cleaned, dried, and dressed sterilely using Dermabond and    Aquacel dressing.  The patient was then brought   to recovery room in stable condition tolerating the procedure well.    Odilia Bennett  Cleotilde Dago, PA-C was present for the entirety of the case involved from   preoperative positioning, perioperative retractor management, general   facilitation of the case, as well as primary wound closure as assistant.            Azalea Lento Bernard Brick, M.D.        06/16/2023 9:34 AM

## 2023-06-17 ENCOUNTER — Encounter (HOSPITAL_COMMUNITY): Payer: Self-pay | Admitting: Orthopedic Surgery

## 2023-06-17 DIAGNOSIS — M1612 Unilateral primary osteoarthritis, left hip: Secondary | ICD-10-CM | POA: Diagnosis not present

## 2023-06-17 DIAGNOSIS — Z7982 Long term (current) use of aspirin: Secondary | ICD-10-CM | POA: Diagnosis not present

## 2023-06-17 DIAGNOSIS — I10 Essential (primary) hypertension: Secondary | ICD-10-CM | POA: Diagnosis not present

## 2023-06-17 DIAGNOSIS — Z96652 Presence of left artificial knee joint: Secondary | ICD-10-CM | POA: Diagnosis not present

## 2023-06-17 DIAGNOSIS — Z79899 Other long term (current) drug therapy: Secondary | ICD-10-CM | POA: Diagnosis not present

## 2023-06-17 LAB — BASIC METABOLIC PANEL WITH GFR
Anion gap: 10 (ref 5–15)
BUN: 20 mg/dL (ref 8–23)
CO2: 21 mmol/L — ABNORMAL LOW (ref 22–32)
Calcium: 8.7 mg/dL — ABNORMAL LOW (ref 8.9–10.3)
Chloride: 100 mmol/L (ref 98–111)
Creatinine, Ser: 1.19 mg/dL (ref 0.61–1.24)
GFR, Estimated: >60 mL/min (ref >60)
Glucose, Bld: 137 mg/dL — ABNORMAL HIGH (ref 70–99)
Potassium: 4 mmol/L (ref 3.5–5.1)
Sodium: 131 mmol/L — ABNORMAL LOW (ref 135–145)

## 2023-06-17 LAB — CBC
HCT: 32.4 % — ABNORMAL LOW (ref 39.0–52.0)
Hemoglobin: 11.1 g/dL — ABNORMAL LOW (ref 13.0–17.0)
MCH: 32.6 pg (ref 26.0–34.0)
MCHC: 34.3 g/dL (ref 30.0–36.0)
MCV: 95 fL (ref 80.0–100.0)
Platelets: 150 10*3/uL (ref 150–400)
RBC: 3.41 MIL/uL — ABNORMAL LOW (ref 4.22–5.81)
RDW: 12.6 % (ref 11.5–15.5)
WBC: 12.1 10*3/uL — ABNORMAL HIGH (ref 4.0–10.5)
nRBC: 0 % (ref 0.0–0.2)

## 2023-06-17 MED ORDER — RIVAROXABAN 10 MG PO TABS
10.0000 mg | ORAL_TABLET | Freq: Every day | ORAL | Status: DC
Start: 2023-06-17 — End: 2023-09-09

## 2023-06-17 NOTE — Care Management Obs Status (Signed)
 MEDICARE OBSERVATION STATUS NOTIFICATION   Patient Details  Name: ALADINO COURY MRN: [id number] Date of Birth: 1946-01-22   Medicare Observation Status Notification Given:  Yes    Bari Leys, RN 06/17/2023, 9:55 AM

## 2023-06-17 NOTE — Plan of Care (Signed)
   Problem: Education: Goal: Knowledge of General Education information will improve Description: Including pain rating scale, medication(s)/side effects and non-pharmacologic comfort measures Outcome: Progressing   Problem: Activity: Goal: Risk for activity intolerance will decrease Outcome: Progressing   Problem: Pain Managment: Goal: General experience of comfort will improve and/or be controlled Outcome: Progressing

## 2023-06-17 NOTE — Progress Notes (Signed)
   Subjective: 1 Day Post-Op Procedure(s) (LRB): ARTHROPLASTY, HIP, TOTAL, ANTERIOR APPROACH (Left) Patient reports pain as mild.   Patient seen in rounds by Dr. Bernard Brick. Patient is resting in bed on exam this morning. No acute events overnight. Foley catheter removed. Patient has not been up with PT yet.  We will start therapy today.   Objective: Vital signs in last 24 hours: Temp:  [96.8 F (36 C)-98.2 F (36.8 C)] 97.6 F (36.4 C) (05/07 0535) Pulse Rate:  [47-77] 61 (05/07 0535) Resp:  [10-18] 18 (05/07 0535) BP: (104-153)/(56-91) 110/61 (05/07 0535) SpO2:  [93 %-100 %] 97 % (05/07 0535) Weight:  [95.3 kg] 95.3 kg (05/06 0851)  Intake/Output from previous day:  Intake/Output Summary (Last 24 hours) at 06/17/2023 0745 Last data filed at 06/17/2023 0530 Gross per 24 hour  Intake 2360 ml  Output 1900 ml  Net 460 ml     Intake/Output this shift: No intake/output data recorded.  Labs: Recent Labs    06/17/23 0343  HGB 11.1*   Recent Labs    06/17/23 0343  WBC 12.1*  RBC 3.41*  HCT 32.4*  PLT 150   Recent Labs    06/17/23 0343  NA 131*  K 4.0  CL 100  CO2 21*  BUN 20  CREATININE 1.19  GLUCOSE 137*  CALCIUM  8.7*   No results for input(s): "LABPT", "INR" in the last 72 hours.  Exam: General - Patient is Alert and Oriented Extremity - Neurologically intact Sensation intact distally Intact pulses distally Dorsiflexion/Plantar flexion intact Dressing - dressing C/D/I Motor Function - intact, moving foot and toes well on exam.   Past Medical History:  Diagnosis Date   Abnormal EKG    Arthritis    Bradycardia    Dizziness    GERD (gastroesophageal reflux disease)    Hx of blood clots    post collar bone fracture, took anticoags for 56mo after   Hyperlipidemia    Hypertension    PVC's (premature ventricular contractions)     Assessment/Plan: 1 Day Post-Op Procedure(s) (LRB): ARTHROPLASTY, HIP, TOTAL, ANTERIOR APPROACH (Left) Principal Problem:    S/P total left hip arthroplasty  Estimated body mass index is 26.96 kg/m as calculated from the following:   Height as of this encounter: 6\' 2"  (1.88 m).   Weight as of this encounter: 95.3 kg. Advance diet Up with therapy D/C IV fluids  DVT Prophylaxis - Xarelto  10 mg  Weight bearing as tolerated.  Hgb stable at 11.1 this AM  Patient has all post op meds at home   Plan is to go Home after hospital stay. Plan for discharge today after meeting goals with therapy. Follow up in the office in 2 weeks.   Kim Pen, PA-C Orthopedic Surgery [phone] 06/17/2023, 7:45 AM

## 2023-06-17 NOTE — Progress Notes (Signed)
 Physical Therapy Treatment Patient Details Name: Stephen Roy MRN: [id number] DOB: May 20, 1945 Today's Date: 06/17/2023   History of Present Illness 78 yo male presents to therapy s/p L  THA on 06/16/2022 due to failure of conservative measures. PMH includes but is not limited to: HTN, HDL, bradycardia, dizziness PE and L shoulder fx, LTKA    PT Comments  Pt making excellent progress; reviewed mobility as below, HEP and progression. Pt is d/c from PT standpoint.   If plan is discharge home, recommend the following: Assistance with cooking/housework;Help with stairs or ramp for entrance;Assist for transportation   Can travel by private vehicle        Equipment Recommendations  None recommended by PT    Recommendations for Other Services       Precautions / Restrictions Precautions Precautions: Fall Restrictions Weight Bearing Restrictions Per Provider Order: No Other Position/Activity Restrictions: WBAT     Mobility  Bed Mobility Overal bed mobility: Needs Assistance Bed Mobility: Supine to Sit, Sit to Supine     Supine to sit: Supervision Sit to supine: Supervision   General bed mobility comments: able to use gait belt as leg lifter    Transfers Overall transfer level: Needs assistance Equipment used: Rolling walker (2 wheels) Transfers: Sit to/from Stand Sit to Stand: Modified independent (Device/Increase time)           General transfer comment: cues for hand placement and safety    Ambulation/Gait Ambulation/Gait assistance: Supervision Gait Distance (Feet): 400 Feet Assistive device: Rolling walker (2 wheels) Gait Pattern/deviations: Step-through pattern       General Gait Details: good wt shift to bil LEs, fluidityof gait improving.   Stairs Stairs: Yes Stairs assistance: Supervision, Contact guard assist Stair Management: No rails, Step to pattern, Forwards, With walker, Two rails Number of Stairs: 2 (x3) General stair comments: cues for  sequence and technique; up/down with RW and then with bil rails; no LOB, light assist/CGA to stabilize RW when ascending/descending wtih RW   Wheelchair Mobility     Tilt Bed    Modified Rankin (Stroke Patients Only)       Balance Overall balance assessment: Needs assistance Sitting-balance support: No upper extremity supported, Feet supported Sitting balance-Leahy Scale: Normal     Standing balance support: Reliant on assistive device for balance, During functional activity Standing balance-Leahy Scale: Fair                              Hotel manager: No apparent difficulties  Cognition Arousal: Alert Behavior During Therapy: WFL for tasks assessed/performed   PT - Cognitive impairments: No apparent impairments                         Following commands: Intact      Cueing Cueing Techniques: Verbal cues  Exercises Total Joint Exercises Ankle Circles/Pumps: AROM, Both, 5 reps Quad Sets: AROM, Strengthening, 5 reps, Both Heel Slides: AAROM, Left (3 reps, verbalizes use of gait belt) Hip ABduction/ADduction: AROM, Left, 5 reps Long Arc Quad: AROM, Strengthening, Left, Seated, 5 reps Knee Flexion: AROM, Left, 5 reps, Standing Marching in Standing: AROM, Left, 5 reps, Standing Standing Hip Extension: AROM, Left, 5 reps    General Comments        Pertinent Vitals/Pain Pain Assessment Pain Assessment: 0-10 Pain Score: 3  Pain Location: L hip Pain Descriptors / Indicators: Aching, Sore Pain Intervention(s): Limited activity within patient's  tolerance, Monitored during session, Premedicated before session, Repositioned, Ice applied    Home Living                          Prior Function            PT Goals (current goals can now be found in the care plan section) Acute Rehab PT Goals Patient Stated Goal: back to skiing PT Goal Formulation: With patient Time For Goal Achievement:  06/24/23 Potential to Achieve Goals: Good Progress towards PT goals: Progressing toward goals    Frequency    7X/week      PT Plan      Co-evaluation              AM-PAC PT "6 Clicks" Mobility   Outcome Measure  Help needed turning from your back to your side while in a flat bed without using bedrails?: A Little Help needed moving from lying on your back to sitting on the side of a flat bed without using bedrails?: A Little Help needed moving to and from a bed to a chair (including a wheelchair)?: A Little Help needed standing up from a chair using your arms (e.g., wheelchair or bedside chair)?: A Little Help needed to walk in hospital room?: A Little Help needed climbing 3-5 steps with a railing? : A Little 6 Click Score: 18    End of Session Equipment Utilized During Treatment: Gait belt Activity Tolerance: Patient tolerated treatment well Patient left: in bed;with bed alarm set;with call bell/phone within reach Nurse Communication: Mobility status PT Visit Diagnosis: Other abnormalities of gait and mobility (R26.89)     Time: 1610-9604 PT Time Calculation (min) (ACUTE ONLY): 33 min  Charges:    $Gait Training: 8-22 mins $Therapeutic Exercise: 8-22 mins PT General Charges $$ ACUTE PT VISIT: 1 Visit                     Alexandria Ida, PT  Acute Rehab Dept [facility]) [phone]  06/17/2023    [facility] 06/17/2023, 3:09 PM

## 2023-06-17 NOTE — Evaluation (Signed)
 Physical Therapy Evaluation Patient Details Name: Stephen Roy MRN: [id number] DOB: Jun 27, 1945 Today's Date: 06/17/2023  History of Present Illness  78 yo male presents to therapy s/p L  THA on 06/16/2022 due to failure of conservative measures. PMH includes but is not limited to: HTN, HDL, bradycardia, dizziness PE and L shoulder fx, LTKA  Clinical Impression  Pt is s/p THA resulting in the deficits listed below (see PT Problem List).  Pt amb ~ 200' with RW and CGA. Very motivated, active/independent at baseline. Will see for a second session and pt should be ready to d/c later today   Pt will benefit from acute skilled PT to increase their independence and safety with mobility to facilitate discharge.          If plan is discharge home, recommend the following: Assistance with cooking/housework;Help with stairs or ramp for entrance;Assist for transportation   Can travel by private vehicle        Equipment Recommendations None recommended by PT  Recommendations for Other Services       Functional Status Assessment Patient has had a recent decline in their functional status and demonstrates the ability to make significant improvements in function in a reasonable and predictable amount of time.     Precautions / Restrictions Precautions Precautions: Fall Restrictions Weight Bearing Restrictions Per Provider Order: No Other Position/Activity Restrictions: WBAT      Mobility  Bed Mobility Overal bed mobility: Needs Assistance Bed Mobility: Supine to Sit, Sit to Supine     Supine to sit: Supervision, HOB elevated Sit to supine: Supervision   General bed mobility comments: able to use gait belt as leg lifter    Transfers Overall transfer level: Needs assistance Equipment used: Rolling walker (2 wheels) Transfers: Sit to/from Stand             General transfer comment: cues for hand placement and safety    Ambulation/Gait Ambulation/Gait assistance: Contact  guard assist, Supervision Gait Distance (Feet): 200 Feet Assistive device: Rolling walker (2 wheels) Gait Pattern/deviations: Step-through pattern       General Gait Details: progression to step though with near equal wt shift to bil LEs, steady gait with no LOB  Stairs            Wheelchair Mobility     Tilt Bed    Modified Rankin (Stroke Patients Only)       Balance Overall balance assessment: Needs assistance Sitting-balance support: No upper extremity supported, Feet supported Sitting balance-Leahy Scale: Normal     Standing balance support: Reliant on assistive device for balance, During functional activity Standing balance-Leahy Scale: Fair                               Pertinent Vitals/Pain Pain Assessment Pain Assessment: 0-10 Pain Score: 3  Pain Location: L hip Pain Descriptors / Indicators: (P) Aching, Sore Pain Intervention(s): Limited activity within patient's tolerance, Monitored during session, Premedicated before session, Repositioned, Ice applied    Home Living Family/patient expects to be discharged to:: Private residence Living Arrangements: Spouse/significant other Available Help at Discharge: Family Type of Home: House Home Access: Stairs to enter Entrance Stairs-Rails: None   Alternate Level Stairs-Number of Steps: flight Home Layout: Two level Home Equipment: Agricultural consultant (2 wheels);Cane - single point;BSC/3in1      Prior Function Prior Level of Function : Independent/Modified Independent  Mobility Comments: very active; golfs and skis       Extremity/Trunk Assessment   Upper Extremity Assessment Upper Extremity Assessment: Overall WFL for tasks assessed    Lower Extremity Assessment Lower Extremity Assessment: LLE deficits/detail LLE Deficits / Details: grossly 3+/5, AAROM WFL--some limitations d/t post op pain       Communication   Communication Communication: No apparent  difficulties    Cognition Arousal: Alert Behavior During Therapy: WFL for tasks assessed/performed   PT - Cognitive impairments: No apparent impairments                         Following commands: Intact       Cueing Cueing Techniques: Verbal cues     General Comments      Exercises Total Joint Exercises Ankle Circles/Pumps: AROM, Both, 5 reps Marching in Standing: AROM, Left, 5 reps Standing Hip Extension: AROM, Left, 5 reps   Assessment/Plan    PT Assessment Patient needs continued PT services  PT Problem List Decreased strength;Decreased activity tolerance;Decreased balance;Decreased knowledge of use of DME;Pain;Decreased mobility;Decreased knowledge of precautions;Decreased range of motion       PT Treatment Interventions DME instruction;Therapeutic exercise;Gait training;Functional mobility training;Therapeutic activities;Patient/family education;Stair training    PT Goals (Current goals can be found in the Care Plan section)  Acute Rehab PT Goals PT Goal Formulation: With patient Time For Goal Achievement: 06/24/23 Potential to Achieve Goals: Good    Frequency 7X/week     Co-evaluation               AM-PAC PT "6 Clicks" Mobility  Outcome Measure Help needed turning from your back to your side while in a flat bed without using bedrails?: A Little Help needed moving from lying on your back to sitting on the side of a flat bed without using bedrails?: A Little Help needed moving to and from a bed to a chair (including a wheelchair)?: A Little Help needed standing up from a chair using your arms (e.g., wheelchair or bedside chair)?: A Little Help needed to walk in hospital room?: A Little Help needed climbing 3-5 steps with a railing? : A Little 6 Click Score: 18    End of Session Equipment Utilized During Treatment: Gait belt Activity Tolerance: Patient tolerated treatment well Patient left: in bed;with bed alarm set;with call bell/phone  within reach Nurse Communication: Mobility status PT Visit Diagnosis: Other abnormalities of gait and mobility (R26.89)    Time: 1191-4782 PT Time Calculation (min) (ACUTE ONLY): 24 min   Charges:   PT Evaluation $PT Eval Low Complexity: 1 Low PT Treatments $Gait Training: 8-22 mins PT General Charges $$ ACUTE PT VISIT: 1 Visit         Kalsey Lull, PT  Acute Rehab Dept [facility]) [phone]  06/17/2023   [facility] 06/17/2023, 11:14 AM

## 2023-06-17 NOTE — Plan of Care (Signed)
  Problem: Education: Goal: Knowledge of General Education information will improve Description: Including pain rating scale, medication(s)/side effects and non-pharmacologic comfort measures Outcome: Progressing   Problem: Clinical Measurements: Goal: Ability to maintain clinical measurements within normal limits will improve Outcome: Progressing   Problem: Safety: Goal: Ability to remain free from injury will improve Outcome: Progressing   Problem: Pain Management: Goal: Pain level will decrease with appropriate interventions Outcome: Progressing

## 2023-06-17 NOTE — TOC Progression Note (Signed)
 Transition of Care [facility]) - Progression Note    Patient Details  Name: Stephen Roy MRN: [id number] Date of Birth: 15-Jan-1946  Transition of [facility]) CM/SW Contact  Bari Leys, RN Phone Number: 06/17/2023, 11:25 AM  Clinical Narrative:   Met with patient at bedside to review dc therapy and home equipment needs, patient confirmed HEP, has RW, no home DME needs. No TOC needs. MOON completed.          Expected Discharge Plan and Services         Expected Discharge Date: 06/17/23               DME Arranged: N/A                     Social Determinants of Health (SDOH) Interventions SDOH Screenings   Food Insecurity: No Food Insecurity (06/16/2023)  Housing: Low Risk  (06/16/2023)  Transportation Needs: No Transportation Needs (06/16/2023)  Utilities: Not At Risk (06/16/2023)  Social Connections: Socially Integrated (06/16/2023)  Tobacco Use: Low Risk  (06/16/2023)    Readmission Risk Interventions     No data to display

## 2023-06-22 DIAGNOSIS — C44319 Basal cell carcinoma of skin of other parts of face: Secondary | ICD-10-CM | POA: Diagnosis not present

## 2023-06-22 DIAGNOSIS — Z85828 Personal history of other malignant neoplasm of skin: Secondary | ICD-10-CM | POA: Diagnosis not present

## 2023-06-23 NOTE — Discharge Summary (Signed)
 Patient ID: Stephen Roy MRN: [id number] DOB/AGE: 1945-10-19 78 y.o.  Admit date: 06/16/2023 Discharge date: 06/17/2023  Admission Diagnoses:  Left hip osteoarthritis  Discharge Diagnoses:  Principal Problem:   S/P total left hip arthroplasty   Past Medical History:  Diagnosis Date   Abnormal EKG    Arthritis    Bradycardia    Dizziness    GERD (gastroesophageal reflux disease)    Hx of blood clots    post collar bone fracture, took anticoags for 58mo after   Hyperlipidemia    Hypertension    PVC's (premature ventricular contractions)     Surgeries: Procedure(s): ARTHROPLASTY, HIP, TOTAL, ANTERIOR APPROACH on 06/16/2023   Consultants:   Discharged Condition: Improved  Hospital Course: Stephen Roy is an 78 y.o. male who was admitted 06/16/2023 for operative treatment ofS/P total left hip arthroplasty. Patient has severe unremitting pain that affects sleep, daily activities, and work/hobbies. After pre-op clearance the patient was taken to the operating room on 06/16/2023 and underwent  Procedure(s): ARTHROPLASTY, HIP, TOTAL, ANTERIOR APPROACH.    Patient was given perioperative antibiotics:  Anti-infectives (From admission, onward)    Start     Dose/Rate Route Frequency Ordered Stop   06/16/23 1700  ceFAZolin  (ANCEF ) IVPB 2g/100 mL premix        2 g 200 mL/hr over 30 Minutes Intravenous Every 6 hours 06/16/23 1538 06/16/23 2340   06/16/23 0900  ceFAZolin  (ANCEF ) IVPB 2g/100 mL premix        2 g 200 mL/hr over 30 Minutes Intravenous On call to O.R. 06/16/23 6962 06/16/23 1103        Patient was given sequential compression devices, early ambulation, and chemoprophylaxis to prevent DVT. Patient worked with PT and was meeting their goals regarding safe ambulation and transfers.  Patient benefited maximally from hospital stay and there were no complications.    Recent vital signs: No data found.   Recent laboratory studies: No results for input(s): "WBC", "HGB", "HCT",  "PLT", "NA", "K", "CL", "CO2", "BUN", "CREATININE", "GLUCOSE", "INR", "CALCIUM " in the last 72 hours.  Invalid input(s): "PT", "2"   Discharge Medications:   Allergies as of 06/17/2023       Reactions   Lisinopril Other (See Comments)   impotence   Simvastatin Other (See Comments)   myalgia        Medication List     STOP taking these medications    aspirin  EC 81 MG tablet   naproxen sodium 220 MG tablet Commonly known as: ALEVE       TAKE these medications    atorvastatin  10 MG tablet Commonly known as: LIPITOR Take 10 mg by mouth in the morning.   FISH OIL PO Take 1 capsule by mouth in the morning.   flecainide  50 MG tablet Commonly known as: TAMBOCOR  Take 1 tablet (50 mg total) by mouth 2 (two) times daily.   hydrochlorothiazide  12.5 MG capsule Commonly known as: MICROZIDE  Take 12.5 mg by mouth in the morning.   irbesartan  300 MG tablet Commonly known as: Avapro  Take 1 tablet (300 mg total) by mouth daily.   multivitamin with minerals Tabs tablet Take 1 tablet by mouth daily. Multivitamin for Men   rivaroxaban  10 MG Tabs tablet Commonly known as: XARELTO  Take 1 tablet (10 mg total) by mouth daily with breakfast for 14 days.   sildenafil 100 MG tablet Commonly known as: VIAGRA Take 100 mg by mouth daily as needed for erectile dysfunction.  Discharge Care Instructions  (From admission, onward)           Start     Ordered   06/17/23 0000  Change dressing       Comments: Maintain surgical dressing until follow up in the clinic. If the edges start to pull up, may reinforce with tape. If the dressing is no longer working, may remove and cover with gauze and tape, but must keep the area dry and clean.  Call with any questions or concerns.   06/17/23 0748            Diagnostic Studies: DG Pelvis Portable Result Date: 06/16/2023 CLINICAL DATA:  Status post hip arthroplasty. EXAM: PORTABLE PELVIS 1-2 VIEWS COMPARISON:  None  Available. FINDINGS: Left hip arthroplasty in expected alignment. No periprosthetic lucency or fracture. Recent postsurgical change includes air and edema in the soft tissues. IMPRESSION: Left hip arthroplasty without immediate postoperative complication. Electronically Signed   By: Chadwick Colonel M.D.   On: 06/16/2023 13:09   DG HIP UNILAT WITH PELVIS 1V LEFT Result Date: 06/16/2023 CLINICAL DATA:  Elective surgery. EXAM: DG HIP (WITH OR WITHOUT PELVIS) 1V*L* COMPARISON:  None Available. FINDINGS: Two fluoroscopic spot views of the pelvis and left hip obtained in the operating room. Images during hip arthroplasty. Fluoroscopy time 7 seconds. Dose 1 mGy. IMPRESSION: Intraoperative fluoroscopy during left hip arthroplasty. Electronically Signed   By: Chadwick Colonel M.D.   On: 06/16/2023 12:12   DG C-Arm 1-60 Min-No Report Result Date: 06/16/2023 Fluoroscopy was utilized by the requesting physician.  No radiographic interpretation.    Disposition: Discharge disposition: 01-Home or Self Care       Discharge Instructions     Call MD / Call 911   Complete by: As directed    If you experience chest pain or shortness of breath, CALL 911 and be transported to the hospital emergency room.  If you develope a fever above 101 F, pus (white drainage) or increased drainage or redness at the wound, or calf pain, call your surgeon's office.   Change dressing   Complete by: As directed    Maintain surgical dressing until follow up in the clinic. If the edges start to pull up, may reinforce with tape. If the dressing is no longer working, may remove and cover with gauze and tape, but must keep the area dry and clean.  Call with any questions or concerns.   Constipation Prevention   Complete by: As directed    Drink plenty of fluids.  Prune juice may be helpful.  You may use a stool softener, such as Colace (over the counter) 100 mg twice a day.  Use MiraLax  (over the counter) for constipation as needed.    Diet - low sodium heart healthy   Complete by: As directed    Increase activity slowly as tolerated   Complete by: As directed    Weight bearing as tolerated with assist device (walker, cane, etc) as directed, use it as long as suggested by your surgeon or therapist, typically at least 4-6 weeks.   Post-operative opioid taper instructions:   Complete by: As directed    POST-OPERATIVE OPIOID TAPER INSTRUCTIONS: It is important to wean off of your opioid medication as soon as possible. If you do not need pain medication after your surgery it is ok to stop day one. Opioids include: Codeine, Hydrocodone (Norco, Vicodin), Oxycodone (Percocet, oxycontin ) and hydromorphone  amongst others.  Long term and even short term use of opiods can cause: Increased  pain response Dependence Constipation Depression Respiratory depression And more.  Withdrawal symptoms can include Flu like symptoms Nausea, vomiting And more Techniques to manage these symptoms Hydrate well Eat regular healthy meals Stay active Use relaxation techniques(deep breathing, meditating, yoga) Do Not substitute Alcohol  to help with tapering If you have been on opioids for less than two weeks and do not have pain than it is ok to stop all together.  Plan to wean off of opioids This plan should start within one week post op of your joint replacement. Maintain the same interval or time between taking each dose and first decrease the dose.  Cut the total daily intake of opioids by one tablet each day Next start to increase the time between doses. The last dose that should be eliminated is the evening dose.      TED hose   Complete by: As directed    Use stockings (TED hose) for 2 weeks on both leg(s).  You may remove them at night for sleeping.        Follow-up Information     Earnie Gola, PA-C. Go on 07/01/2023.   Specialty: Orthopedic Surgery Why: You are scheduled for first post op appt on Wednesday May 21 at  3:45pm. Contact information: [address] [place] [address] [place] [address] [phone]                  Signed: Earnie Gola 06/23/2023, 7:16 AM

## 2023-08-03 DIAGNOSIS — I8391 Asymptomatic varicose veins of right lower extremity: Secondary | ICD-10-CM | POA: Diagnosis not present

## 2023-08-03 DIAGNOSIS — Z85828 Personal history of other malignant neoplasm of skin: Secondary | ICD-10-CM | POA: Diagnosis not present

## 2023-08-03 DIAGNOSIS — D225 Melanocytic nevi of trunk: Secondary | ICD-10-CM | POA: Diagnosis not present

## 2023-08-03 DIAGNOSIS — D2261 Melanocytic nevi of right upper limb, including shoulder: Secondary | ICD-10-CM | POA: Diagnosis not present

## 2023-08-03 DIAGNOSIS — D2262 Melanocytic nevi of left upper limb, including shoulder: Secondary | ICD-10-CM | POA: Diagnosis not present

## 2023-08-03 DIAGNOSIS — L821 Other seborrheic keratosis: Secondary | ICD-10-CM | POA: Diagnosis not present

## 2023-08-03 DIAGNOSIS — L570 Actinic keratosis: Secondary | ICD-10-CM | POA: Diagnosis not present

## 2023-08-03 DIAGNOSIS — D1801 Hemangioma of skin and subcutaneous tissue: Secondary | ICD-10-CM | POA: Diagnosis not present

## 2023-08-03 DIAGNOSIS — L905 Scar conditions and fibrosis of skin: Secondary | ICD-10-CM | POA: Diagnosis not present

## 2023-08-03 DIAGNOSIS — L814 Other melanin hyperpigmentation: Secondary | ICD-10-CM | POA: Diagnosis not present

## 2023-08-03 DIAGNOSIS — L918 Other hypertrophic disorders of the skin: Secondary | ICD-10-CM | POA: Diagnosis not present

## 2023-08-06 DIAGNOSIS — Z5189 Encounter for other specified aftercare: Secondary | ICD-10-CM | POA: Diagnosis not present

## 2023-08-06 DIAGNOSIS — Z96652 Presence of left artificial knee joint: Secondary | ICD-10-CM | POA: Diagnosis not present

## 2023-08-06 DIAGNOSIS — M25562 Pain in left knee: Secondary | ICD-10-CM | POA: Diagnosis not present

## 2023-09-09 NOTE — Patient Instructions (Signed)
 SURGICAL WAITING ROOM VISITATION Patients having surgery or a procedure may have no more than 2 support people in the waiting area - these visitors may rotate in the visitor waiting room.   Due to an increase in RSV and influenza rates and associated hospitalizations, children ages 28 and under may not visit patients in [facility] hospitals. If the patient needs to stay at the hospital during part of their recovery, the visitor guidelines for inpatient rooms apply.  PRE-OP VISITATION  Pre-op nurse will coordinate an appropriate time for 1 support person to accompany the patient in pre-op.  This support person may not rotate.  This visitor will be contacted when the time is appropriate for the visitor to come back in the pre-op area.  Please refer to the [facility] website for the visitor guidelines for Inpatients (after your surgery is over and you are in a regular room).  You are not required to quarantine at this time prior to your surgery. However, you must do this: Hand Hygiene often Do NOT share personal items Notify your provider if you are in close contact with someone who has COVID or you develop fever 100.4 or greater, new onset of sneezing, cough, sore throat, shortness of breath or body aches.  If you test positive for Covid or have been in contact with anyone that has tested positive in the last 10 days please notify you surgeon.    Your procedure is scheduled on:  09/22/23  Report to [facility] Main Entrance: [place] entrance where the [employer] is available.   Report to admitting at: 11:00 AM  Call this number if you have any questions or problems the morning of surgery [phone]  FOLLOW ANY ADDITIONAL PRE OP INSTRUCTIONS YOU RECEIVED FROM YOUR SURGEON'S OFFICE!!!  Do not eat food after Midnight the night prior to your surgery/procedure.  After Midnight you may have the following liquids until : 10:30 AM DAY OF SURGERY  Clear Liquid Diet Water  Black  Coffee (sugar ok, NO MILK/CREAM OR CREAMERS)  Tea (sugar ok, NO MILK/CREAM OR CREAMERS) regular and decaf                             Plain Jell-O  with no fruit (NO RED)                                           Fruit ices (not with fruit pulp, NO RED)                                     Popsicles (NO RED)                                                                  Juice: NO CITRUS JUICES: only apple, WHITE grape, WHITE cranberry Sports drinks like Gatorade or Powerade (NO RED)   The day of surgery:  Drink ONE (1) Pre-Surgery Clear Ensure at: 10:30 AM the morning of surgery. Drink in one sitting. Do not sip.  This drink was given  to you during your hospital pre-op appointment visit. Nothing else to drink after completing the Pre-Surgery Clear Ensure or G2 : No candy, chewing gum or throat lozenges.    Oral Hygiene is also important to reduce your risk of infection.        Remember - BRUSH YOUR TEETH THE MORNING OF SURGERY WITH YOUR REGULAR TOOTHPASTE  Do NOT smoke after Midnight the night before surgery.  STOP TAKING all Vitamins, Herbs and supplements 1 week before your surgery.   Take ONLY these medicines the morning of surgery with A SIP OF WATER : flecaine.  If You have been diagnosed with Sleep Apnea - Bring CPAP mask and tubing day of surgery. We will provide you with a CPAP machine on the day of your surgery.                   You may not have any metal on your body including hair pins, jewelry, and body piercing  Do not wear lotions, powders, perfumes / cologne, or deodorant  Men may shave face and neck.  Contacts, Hearing Aids, dentures or bridgework may not be worn into surgery. DENTURES WILL BE REMOVED PRIOR TO SURGERY PLEASE DO NOT APPLY Poly grip OR ADHESIVES!!!  You may bring a small overnight bag with you on the day of surgery, only pack items that are not valuable.  IS NOT RESPONSIBLE   FOR VALUABLES THAT ARE LOST OR STOLEN.   Patients discharged on  the day of surgery will not be allowed to drive home.  Someone NEEDS to stay with you for the first 24 hours after anesthesia.  Do not bring your home medications to the hospital. The Pharmacy will dispense medications listed on your medication list to you during your admission in the Hospital.  Special Instructions: Bring a copy of your healthcare power of attorney and living will documents the day of surgery, if you wish to have them scanned into your [facility] Records- EPIC  Please read over the following fact sheets you were given: IF YOU HAVE QUESTIONS ABOUT YOUR PRE-OP INSTRUCTIONS, PLEASE CALL [phone]   [facility] - Preparing for Surgery Before surgery, you can play an important role.  Because skin is not sterile, your skin needs to be as free of germs as possible.  You can reduce the number of germs on your skin by washing with CHG (chlorahexidine gluconate) soap before surgery.  CHG is an antiseptic cleaner which kills germs and bonds with the skin to continue killing germs even after washing. Please DO NOT use if you have an allergy to CHG or antibacterial soaps.  If your skin becomes reddened/irritated stop using the CHG and inform your nurse when you arrive at Short Stay. Do not shave (including legs and underarms) for at least 48 hours prior to the first CHG shower.  You may shave your face/neck.  Please follow these instructions carefully:  1.  Shower with CHG Soap the night before surgery and the  morning of surgery.  2.  If you choose to wash your hair, wash your hair first as usual with your normal  shampoo.  3.  After you shampoo, rinse your hair and body thoroughly to remove the shampoo.                             4.  Use CHG as you would any other liquid soap.  You can apply chg directly  to the skin and wash.  Gently with a scrungie or clean washcloth.  5.  Apply the CHG Soap to your body ONLY FROM THE NECK DOWN.   Do not use on face/ open                            Wound or open sores. Avoid contact with eyes, ears mouth and genitals (private parts).                       Wash face,  Genitals (private parts) with your normal soap.             6.  Wash thoroughly, paying special attention to the area where your  surgery  will be performed.  7.  Thoroughly rinse your body with warm water  from the neck down.  8.  DO NOT shower/wash with your normal soap after using and rinsing off the CHG Soap.            9.  Pat yourself dry with a clean towel.            10.  Wear clean pajamas.            11.  Place clean sheets on your bed the night of your first shower and do not  sleep with pets.  ON THE DAY OF SURGERY : Do not apply any lotions/deodorants the morning of surgery.  Please wear clean clothes to the hospital/surgery center.     FAILURE TO FOLLOW THESE INSTRUCTIONS MAY RESULT IN THE CANCELLATION OF YOUR SURGERY  PATIENT SIGNATURE_________________________________  NURSE SIGNATURE__________________________________  ________________________________________________________________________    Pre-operative 5 CHG Bath Instructions   You can play a key role in reducing the risk of infection after surgery. Your skin needs to be as free of germs as possible. You can reduce the number of germs on your skin by washing with CHG (chlorhexidine  gluconate) soap before surgery. CHG is an antiseptic soap that kills germs and continues to kill germs even after washing.   DO NOT use if you have an allergy to chlorhexidine /CHG or antibacterial soaps. If your skin becomes reddened or irritated, stop using the CHG and notify one of our RNs at [phone].   Please shower with the CHG soap starting 4 days before surgery using the following schedule:     Please keep in mind the following:  DO NOT shave, including legs and underarms, starting the day of your first shower.   You may shave your face at any point before/day of surgery.  Place clean sheets on your  bed the day you start using CHG soap. Use a clean washcloth (not used since being washed) for each shower. DO NOT sleep with pets once you start using the CHG.   CHG Shower Instructions:  If you choose to wash your hair and private area, wash first with your normal shampoo/soap.  After you use shampoo/soap, rinse your hair and body thoroughly to remove shampoo/soap residue.  Turn the water  OFF and apply about 3 tablespoons (45 ml) of CHG soap to a CLEAN washcloth.  Apply CHG soap ONLY FROM YOUR NECK DOWN TO YOUR TOES (washing for 3-5 minutes)  DO NOT use CHG soap on face, private areas, open wounds, or sores.  Pay special attention to the area where your surgery is being performed.  If you are having back surgery, having someone wash your back for you may  be helpful. Wait 2 minutes after CHG soap is applied, then you may rinse off the CHG soap.  Pat dry with a clean towel  Put on clean clothes/pajamas   If you choose to wear lotion, please use ONLY the CHG-compatible lotions on the back of this paper.     Additional instructions for the day of surgery: DO NOT APPLY any lotions, deodorants, cologne, or perfumes.   Put on clean/comfortable clothes.  Brush your teeth.  Ask your nurse before applying any prescription medications to the skin.   CHG Compatible Lotions   Aveeno Moisturizing lotion  Cetaphil Moisturizing Cream  Cetaphil Moisturizing Lotion  Clairol Herbal Essence Moisturizing Lotion, Dry Skin  Clairol Herbal Essence Moisturizing Lotion, Extra Dry Skin  Clairol Herbal Essence Moisturizing Lotion, Normal Skin  Curel Age Defying Therapeutic Moisturizing Lotion with Alpha Hydroxy  Curel Extreme Care Body Lotion  Curel Soothing Hands Moisturizing Hand Lotion  Curel Therapeutic Moisturizing Cream, Fragrance-Free  Curel Therapeutic Moisturizing Lotion, Fragrance-Free  Curel Therapeutic Moisturizing Lotion, Original Formula  Eucerin Daily Replenishing Lotion  Eucerin Dry Skin  Therapy Plus Alpha Hydroxy Crme  Eucerin Dry Skin Therapy Plus Alpha Hydroxy Lotion  Eucerin Original Crme  Eucerin Original Lotion  Eucerin Plus Crme Eucerin Plus Lotion  Eucerin TriLipid Replenishing Lotion  Keri Anti-Bacterial Hand Lotion  Keri Deep Conditioning Original Lotion Dry Skin Formula Softly Scented  Keri Deep Conditioning Original Lotion, Fragrance Free Sensitive Skin Formula  Keri Lotion Fast Absorbing Fragrance Free Sensitive Skin Formula  Keri Lotion Fast Absorbing Softly Scented Dry Skin Formula  Keri Original Lotion  Keri Skin Renewal Lotion Keri Silky Smooth Lotion  Keri Silky Smooth Sensitive Skin Lotion  Nivea Body Creamy Conditioning Oil  Nivea Body Extra Enriched Lotion  Nivea Body Original Lotion  Nivea Body Sheer Moisturizing Lotion Nivea Crme  Nivea Skin Firming Lotion  NutraDerm 30 Skin Lotion  NutraDerm Skin Lotion  NutraDerm Therapeutic Skin Cream  NutraDerm Therapeutic Skin Lotion  ProShield Protective Hand Cream  Provon moisturizing lotion   Incentive Spirometer  An incentive spirometer is a tool that can help keep your lungs clear and active. This tool measures how well you are filling your lungs with each breath. Taking long deep breaths may help reverse or decrease the chance of developing breathing (pulmonary) problems (especially infection) following: A long period of time when you are unable to move or be active. BEFORE THE PROCEDURE  If the spirometer includes an indicator to show your best effort, your nurse or respiratory therapist will set it to a desired goal. If possible, sit up straight or lean slightly forward. Try not to slouch. Hold the incentive spirometer in an upright position. INSTRUCTIONS FOR USE  Sit on the edge of your bed if possible, or sit up as far as you can in bed or on a chair. Hold the incentive spirometer in an upright position. Breathe out normally. Place the mouthpiece in your mouth and seal your lips tightly  around it. Breathe in slowly and as deeply as possible, raising the piston or the ball toward the top of the column. Hold your breath for 3-5 seconds or for as long as possible. Allow the piston or ball to fall to the bottom of the column. Remove the mouthpiece from your mouth and breathe out normally. Rest for a few seconds and repeat Steps 1 through 7 at least 10 times every 1-2 hours when you are awake. Take your time and take a few normal breaths between deep  breaths. The spirometer may include an indicator to show your best effort. Use the indicator as a goal to work toward during each repetition. After each set of 10 deep breaths, practice coughing to be sure your lungs are clear. If you have an incision (the cut made at the time of surgery), support your incision when coughing by placing a pillow or rolled up towels firmly against it. Once you are able to get out of bed, walk around indoors and cough well. You may stop using the incentive spirometer when instructed by your caregiver.  RISKS AND COMPLICATIONS Take your time so you do not get dizzy or light-headed. If you are in pain, you may need to take or ask for pain medication before doing incentive spirometry. It is harder to take a deep breath if you are having pain. AFTER USE Rest and breathe slowly and easily. It can be helpful to keep track of a log of your progress. Your caregiver can provide you with a simple table to help with this. If you are using the spirometer at home, follow these instructions: SEEK MEDICAL CARE IF:  You are having difficultly using the spirometer. You have trouble using the spirometer as often as instructed. Your pain medication is not giving enough relief while using the spirometer. You develop fever of 100.5 F (38.1 C) or higher. SEEK IMMEDIATE MEDICAL CARE IF:  You cough up bloody sputum that had not been present before. You develop fever of 102 F (38.9 C) or greater. You develop worsening pain  at or near the incision site. MAKE SURE YOU:  Understand these instructions. Will watch your condition. Will get help right away if you are not doing well or get worse. Document Released: 06/09/2006 Document Revised: 04/21/2011 Document Reviewed: 08/10/2006 [facility] Patient Information 2014 [place], [place].   ________________________________________________________________________

## 2023-09-14 ENCOUNTER — Encounter (HOSPITAL_COMMUNITY): Payer: Self-pay

## 2023-09-14 ENCOUNTER — Encounter (HOSPITAL_COMMUNITY)
Admission: RE | Admit: 2023-09-14 | Discharge: 2023-09-14 | Disposition: A | Discharge status: Home / Self Care | Source: Ambulatory Visit | Attending: Orthopedic Surgery | Admitting: Orthopedic Surgery

## 2023-09-14 ENCOUNTER — Other Ambulatory Visit: Payer: Self-pay

## 2023-09-14 VITALS — BP 154/77 | HR 47 | Temp 97.6°F | Ht 74.0 in | Wt 204.0 lb

## 2023-09-14 DIAGNOSIS — H524 Presbyopia: Secondary | ICD-10-CM | POA: Diagnosis not present

## 2023-09-14 DIAGNOSIS — I10 Essential (primary) hypertension: Secondary | ICD-10-CM | POA: Diagnosis not present

## 2023-09-14 DIAGNOSIS — Z01812 Encounter for preprocedural laboratory examination: Secondary | ICD-10-CM | POA: Diagnosis not present

## 2023-09-14 DIAGNOSIS — H2513 Age-related nuclear cataract, bilateral: Secondary | ICD-10-CM | POA: Diagnosis not present

## 2023-09-14 DIAGNOSIS — M1611 Unilateral primary osteoarthritis, right hip: Secondary | ICD-10-CM | POA: Diagnosis not present

## 2023-09-14 DIAGNOSIS — Z01818 Encounter for other preprocedural examination: Secondary | ICD-10-CM | POA: Diagnosis present

## 2023-09-14 HISTORY — DX: Cardiac arrhythmia, unspecified: I49.9

## 2023-09-14 LAB — CBC
HCT: 43.1 % (ref 39.0–52.0)
Hemoglobin: 13.9 g/dL (ref 13.0–17.0)
MCH: 31.4 pg (ref 26.0–34.0)
MCHC: 32.3 g/dL (ref 30.0–36.0)
MCV: 97.3 fL (ref 80.0–100.0)
Platelets: 165 K/uL (ref 150–400)
RBC: 4.43 MIL/uL (ref 4.22–5.81)
RDW: 12.6 % (ref 11.5–15.5)
WBC: 5.1 K/uL (ref 4.0–10.5)
nRBC: 0 % (ref 0.0–0.2)

## 2023-09-14 LAB — BASIC METABOLIC PANEL WITH GFR
Anion gap: 9 (ref 5–15)
BUN: 18 mg/dL (ref 8–23)
CO2: 26 mmol/L (ref 22–32)
Calcium: 9.6 mg/dL (ref 8.9–10.3)
Chloride: 105 mmol/L (ref 98–111)
Creatinine, Ser: 0.95 mg/dL (ref 0.61–1.24)
GFR, Estimated: >60 mL/min (ref >60)
Glucose, Bld: 97 mg/dL (ref 70–99)
Potassium: 4.3 mmol/L (ref 3.5–5.1)
Sodium: 140 mmol/L (ref 135–145)

## 2023-09-14 LAB — SURGICAL PCR SCREEN
MRSA, PCR: NEGATIVE
Staphylococcus aureus: NEGATIVE

## 2023-09-14 NOTE — Progress Notes (Signed)
 For Anesthesia: PCP - [name], MD  Cardiologist - N/A Inocencio Soyla Lunger, MD Electrophysiology  Clearance: Lesia Ozell Barter, PA-C  :  Bowel Prep reminder:  Chest x-ray -  EKG - 06/04/23 Stress Test -  ECHO - 11/20/21 Cardiac Cath -  Pacemaker/ICD device last checked: Pacemaker orders received: Device Rep notified:  Spinal Cord Stimulator:N/A  Sleep Study - N/A CPAP -   Fasting Blood Sugar - N/A Checks Blood Sugar _____ times a day Date and result of last Hgb A1c-  Last dose of GLP1 agonist- N/A GLP1 instructions:   Last dose of SGLT-2 inhibitors- N/A SGLT-2 instructions:   Blood Thinner Instructions: Aspirin  Instructions: To hold it a week before surgery. Last Dose:  Activity level: Can go up a flight of stairs and activities of daily living without stopping and without chest pain and/or shortness of breath   Able to exercise without chest pain and/or shortness of breath  Anesthesia review: Hx: PVCs, sinus bradycardia, HTN   Patient denies shortness of breath, fever, cough and chest pain at PAT appointment   Patient verbalized understanding of instructions that were reviewed over the telephone.

## 2023-09-15 NOTE — H&P (Signed)
 TOTAL HIP ADMISSION H&P  Patient is admitted for right total hip arthroplasty.  Therapy Plans: HEP Disposition: Home with wife Planned DVT Prophylaxis: aspirin  81mg  BID DME needed: none PCP: PCP: Dr. Larnell - clearance & labs received TXA: IV Allergies: lisinopril , simvastatin Anesthesia Concerns: none BMI: 27.3 Last HgbA1c: not diabetic   Other: - oxycodone , robaxin , tylenol , celebrex (send ahead) - Hx of UE blood clot related to clavicle fracture - never other clots - [place]  Subjective:  Chief Complaint: right hip pain  HPI: Stephen Roy, 78 y.o. male, has a history of pain and functional disability in the right hip(s) due to arthritis and patient has failed non-surgical conservative treatments for greater than 12 weeks to include NSAID's and/or analgesics, corticosteriod injections, and activity modification.  Onset of symptoms was gradual starting 1 years ago with gradually worsening course since that time.The patient noted no past surgery on the right hip(s).  Patient currently rates pain in the right hip at 7 out of 10 with activity. Patient has worsening of pain with activity and weight bearing, pain that interfers with activities of daily living, and pain with passive range of motion. Patient has evidence of joint space narrowing by imaging studies. This condition presents safety issues increasing the risk of falls.  There is no current active infection.  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   Hypertension    Hyperlipidemia    Dizziness    Bradycardia    Abnormal EKG    Past Medical History:  Diagnosis Date   Abnormal EKG    Arthritis    Bradycardia    Dizziness    Dysrhythmia    GERD (gastroesophageal reflux disease)    Hx of blood clots    post collar bone fracture, took anticoags for 71mo  after   Hyperlipidemia    Hypertension    PVC's (premature ventricular contractions)     Past Surgical History:  Procedure Laterality Date   CLOSED REDUCTION HAND FRACTURE Right 1961   COLONOSCOPY  10/2015   jacobs hx polyps   FRACTURE SURGERY Left 2001   shoulder   TONSILLECTOMY  10/1949   TOTAL HIP ARTHROPLASTY Left 06/16/2023   Procedure: ARTHROPLASTY, HIP, TOTAL, ANTERIOR APPROACH;  Surgeon: Ernie Cough, MD;  Location: [facility];  Service: Orthopedics;  Laterality: Left;   TOTAL KNEE ARTHROPLASTY Left 06/24/2022   Procedure: TOTAL KNEE ARTHROPLASTY;  Surgeon: Ernie Cough, MD;  Location: [facility];  Service: Orthopedics;  Laterality: Left;    No current facility-administered medications for this encounter.   Current Outpatient Medications  Medication Sig Dispense Refill Last Dose/Taking   amoxicillin (AMOXIL) 500 MG tablet Take 2,000 mg by mouth See admin instructions. Take 4 capsules (2000 mg) by mouth 1 hour prior to dental appointments.   Taking   aspirin  81 MG chewable tablet Chew 81 mg by mouth in the morning.   Taking   atorvastatin  (LIPITOR) 10 MG tablet Take 10 mg by mouth in the morning.   Taking   flecainide  (TAMBOCOR ) 50 MG tablet Take 1 tablet (50 mg total) by mouth 2 (two) times daily. 180 tablet 3 Taking   irbesartan  (AVAPRO ) 300 MG tablet Take 1 tablet (300 mg total) by mouth daily. 90 tablet 2 Taking   Multiple Vitamin (MULTIVITAMIN WITH MINERALS) TABS tablet Take 1 tablet  by mouth daily. Multivitamin for Men   Taking   Omega-3 Fatty Acids (FISH OIL PO) Take 1 capsule by mouth in the morning.   Taking   sildenafil (VIAGRA) 100 MG tablet Take 100 mg by mouth daily as needed for erectile dysfunction.   Taking As Needed   Allergies  Allergen Reactions   Lisinopril Other (See Comments)    impotence   Simvastatin Other (See Comments)    myalgia    Social History   Tobacco Use   Smoking status: Never   Smokeless tobacco: Never  Substance Use Topics   Alcohol   use: Not Currently    Alcohol /week: 2.0 standard drinks of alcohol     Types: 2 Cans of beer per week    Comment: daily    Family History  Problem Relation Age of Onset   Heart disease Father    Colon cancer Neg Hx    Rectal cancer Neg Hx    Stomach cancer Neg Hx    Esophageal cancer Neg Hx      Review of Systems  Constitutional:  Negative for chills and fever.  Respiratory:  Negative for cough and shortness of breath.   Cardiovascular:  Negative for chest pain.  Gastrointestinal:  Negative for nausea and vomiting.  Musculoskeletal:  Positive for arthralgias.     Objective:  Physical Exam Well nourished and well developed. General: Alert and oriented x3, cooperative and pleasant, no acute distress.  Musculoskeletal: Despite his radiographic findings of arthritis he has maintained pretty good motion in his hips. He is noted to have hip flexion internal rotation close to 20 degrees with external rotation to 30 degrees No significant external rotation contracture noted with active hip flexion Neurovascular intact distally  Vital signs in last 24 hours:    Labs:   Estimated body mass index is 26.19 kg/m as calculated from the following:   Height as of 09/14/23: 6' 2 (1.88 m).   Weight as of 09/14/23: 92.5 kg.   Imaging Review Plain radiographs demonstrate severe degenerative joint disease of the right hip(s). The bone quality appears to be adequate for age and reported activity level.      Assessment/Plan:  End stage arthritis, right hip(s)  The patient history, physical examination, clinical judgement of the provider and imaging studies are consistent with end stage degenerative joint disease of the right hip(s) and total hip arthroplasty is deemed medically necessary. The treatment options including medical management, injection therapy, arthroscopy and arthroplasty were discussed at length. The risks and benefits of total hip arthroplasty were presented and  reviewed. The risks due to aseptic loosening, infection, stiffness, dislocation/subluxation,  thromboembolic complications and other imponderables were discussed.  The patient acknowledged the explanation, agreed to proceed with the plan and consent was signed. Patient is being admitted for inpatient treatment for surgery, pain control, PT, OT, prophylactic antibiotics, VTE prophylaxis, progressive ambulation and ADL's and discharge planning.The patient is planning to be discharged home.   Rosina Calin, PA-C Orthopedic Surgery [facility] [place] [phone]

## 2023-09-21 DIAGNOSIS — H0012 Chalazion right lower eyelid: Secondary | ICD-10-CM | POA: Diagnosis not present

## 2023-09-22 ENCOUNTER — Other Ambulatory Visit: Payer: Self-pay

## 2023-09-22 ENCOUNTER — Observation Stay (HOSPITAL_COMMUNITY)

## 2023-09-22 ENCOUNTER — Ambulatory Visit (HOSPITAL_BASED_OUTPATIENT_CLINIC_OR_DEPARTMENT_OTHER): Payer: Self-pay

## 2023-09-22 ENCOUNTER — Ambulatory Visit (HOSPITAL_COMMUNITY): Payer: Self-pay | Admitting: Medical

## 2023-09-22 ENCOUNTER — Encounter (HOSPITAL_COMMUNITY): Payer: Self-pay | Admitting: Orthopedic Surgery

## 2023-09-22 ENCOUNTER — Encounter (HOSPITAL_COMMUNITY)
Admission: RE | Disposition: A | Discharge status: Home / Self Care | Payer: Self-pay | Source: Home / Self Care | Attending: Orthopedic Surgery

## 2023-09-22 ENCOUNTER — Ambulatory Visit (HOSPITAL_COMMUNITY)

## 2023-09-22 ENCOUNTER — Observation Stay (HOSPITAL_COMMUNITY)
Admission: RE | Admit: 2023-09-22 | Discharge: 2023-09-23 | Disposition: A | Discharge status: Home / Self Care | Attending: Orthopedic Surgery | Admitting: Orthopedic Surgery

## 2023-09-22 DIAGNOSIS — Z96641 Presence of right artificial hip joint: Secondary | ICD-10-CM

## 2023-09-22 DIAGNOSIS — M1611 Unilateral primary osteoarthritis, right hip: Principal | ICD-10-CM

## 2023-09-22 DIAGNOSIS — M1612 Unilateral primary osteoarthritis, left hip: Secondary | ICD-10-CM | POA: Diagnosis not present

## 2023-09-22 DIAGNOSIS — Z96642 Presence of left artificial hip joint: Secondary | ICD-10-CM | POA: Diagnosis not present

## 2023-09-22 DIAGNOSIS — I10 Essential (primary) hypertension: Secondary | ICD-10-CM | POA: Insufficient documentation

## 2023-09-22 DIAGNOSIS — Z471 Aftercare following joint replacement surgery: Secondary | ICD-10-CM | POA: Diagnosis not present

## 2023-09-22 DIAGNOSIS — Z96643 Presence of artificial hip joint, bilateral: Secondary | ICD-10-CM | POA: Diagnosis not present

## 2023-09-22 DIAGNOSIS — Z7982 Long term (current) use of aspirin: Secondary | ICD-10-CM | POA: Diagnosis not present

## 2023-09-22 HISTORY — PX: TOTAL HIP ARTHROPLASTY: SHX124

## 2023-09-22 LAB — TYPE AND SCREEN
ABO/RH(D): O NEG
Antibody Screen: NEGATIVE

## 2023-09-22 LAB — GLUCOSE, CAPILLARY: Glucose-Capillary: 223 mg/dL — ABNORMAL HIGH (ref 70–99)

## 2023-09-22 SURGERY — ARTHROPLASTY, HIP, TOTAL, ANTERIOR APPROACH
Anesthesia: Spinal | Site: Hip | Laterality: Right

## 2023-09-22 MED ORDER — POVIDONE-IODINE 10 % EX SWAB
2.0000 | Freq: Once | CUTANEOUS | Status: AC
Start: 2023-09-22 — End: 2023-09-22
  Administered 2023-09-22 (×2): 2 via TOPICAL

## 2023-09-22 MED ORDER — DIPHENHYDRAMINE HCL 12.5 MG/5ML PO ELIX: 12.5000 mg | ORAL_SOLUTION | ORAL | Status: DC | PRN

## 2023-09-22 MED ORDER — OXYCODONE HCL 5 MG PO TABS: 10.0000 mg | ORAL_TABLET | ORAL | Status: DC | PRN

## 2023-09-22 MED ORDER — ALBUMIN HUMAN 5 % IV SOLN
INTRAVENOUS | Status: AC
  Filled 2023-09-22: qty 250

## 2023-09-22 MED ORDER — METHOCARBAMOL 500 MG PO TABS
500.0000 mg | ORAL_TABLET | Freq: Four times a day (QID) | ORAL | Status: DC | PRN
  Filled 2023-09-22: qty 1

## 2023-09-22 MED ORDER — 0.9 % SODIUM CHLORIDE (POUR BTL) OPTIME
TOPICAL | Status: DC | PRN
  Administered 2023-09-22 (×2): 1000 mL

## 2023-09-22 MED ORDER — METOCLOPRAMIDE HCL 5 MG/ML IJ SOLN: 5.0000 mg | Freq: Three times a day (TID) | INTRAMUSCULAR | Status: DC | PRN

## 2023-09-22 MED ORDER — IRBESARTAN 150 MG PO TABS
300.0000 mg | ORAL_TABLET | Freq: Every day | ORAL | Status: DC
  Administered 2023-09-23 (×2): 300 mg via ORAL
  Filled 2023-09-22: qty 2

## 2023-09-22 MED ORDER — CHLORHEXIDINE GLUCONATE 0.12 % MT SOLN
15.0000 mL | Freq: Once | OROMUCOSAL | Status: AC
  Administered 2023-09-22 (×2): 15 mL via OROMUCOSAL

## 2023-09-22 MED ORDER — PHENOL 1.4 % MT LIQD
1.0000 | OROMUCOSAL | Status: DC | PRN
Start: 2023-09-22 — End: 2023-09-23

## 2023-09-22 MED ORDER — ACETAMINOPHEN 10 MG/ML IV SOLN
INTRAVENOUS | Status: AC
  Filled 2023-09-22: qty 100

## 2023-09-22 MED ORDER — METOCLOPRAMIDE HCL 5 MG PO TABS: 5.0000 mg | ORAL_TABLET | Freq: Three times a day (TID) | ORAL | Status: DC | PRN

## 2023-09-22 MED ORDER — KETOROLAC TROMETHAMINE 30 MG/ML IJ SOLN
INTRAMUSCULAR | Status: DC | PRN
  Administered 2023-09-22 (×2): 61 mL

## 2023-09-22 MED ORDER — ALUM & MAG HYDROXIDE-SIMETH 200-200-20 MG/5ML PO SUSP: 30.0000 mL | ORAL | Status: DC | PRN

## 2023-09-22 MED ORDER — TRANEXAMIC ACID-NACL 1000-0.7 MG/100ML-% IV SOLN
1000.0000 mg | Freq: Once | INTRAVENOUS | Status: AC
  Administered 2023-09-22 (×2): 1000 mg via INTRAVENOUS
  Filled 2023-09-22: qty 100

## 2023-09-22 MED ORDER — TRANEXAMIC ACID-NACL 1000-0.7 MG/100ML-% IV SOLN
1000.0000 mg | INTRAVENOUS | Status: AC
  Administered 2023-09-22 (×2): 1000 mg via INTRAVENOUS
  Filled 2023-09-22: qty 100

## 2023-09-22 MED ORDER — PROPOFOL 500 MG/50ML IV EMUL
INTRAVENOUS | Status: DC | PRN
  Administered 2023-09-22 (×5): 30 mg via INTRAVENOUS
  Administered 2023-09-22: 50 ug/kg/min via INTRAVENOUS
  Administered 2023-09-22: 20 mg via INTRAVENOUS
  Administered 2023-09-22: 30 mg via INTRAVENOUS
  Administered 2023-09-22 (×3): 20 mg via INTRAVENOUS
  Administered 2023-09-22: 50 ug/kg/min via INTRAVENOUS

## 2023-09-22 MED ORDER — DEXAMETHASONE SODIUM PHOSPHATE 10 MG/ML IJ SOLN
INTRAMUSCULAR | Status: AC
  Filled 2023-09-22: qty 1

## 2023-09-22 MED ORDER — DEXAMETHASONE SODIUM PHOSPHATE 10 MG/ML IJ SOLN
10.0000 mg | Freq: Once | INTRAMUSCULAR | Status: AC
  Administered 2023-09-23 (×2): 10 mg via INTRAVENOUS
  Filled 2023-09-22: qty 1

## 2023-09-22 MED ORDER — STERILE WATER FOR IRRIGATION IR SOLN
Status: DC | PRN
  Administered 2023-09-22 (×2): 2000 mL

## 2023-09-22 MED ORDER — CEFAZOLIN SODIUM-DEXTROSE 2-4 GM/100ML-% IV SOLN
2.0000 g | Freq: Four times a day (QID) | INTRAVENOUS | Status: AC
  Administered 2023-09-22 – 2023-09-23 (×4): 2 g via INTRAVENOUS
  Filled 2023-09-22 (×2): qty 100

## 2023-09-22 MED ORDER — KETOROLAC TROMETHAMINE 15 MG/ML IJ SOLN
7.5000 mg | Freq: Four times a day (QID) | INTRAMUSCULAR | Status: AC
  Administered 2023-09-22 – 2023-09-23 (×4): 7.5 mg via INTRAVENOUS
  Filled 2023-09-22 (×2): qty 1

## 2023-09-22 MED ORDER — BUPIVACAINE IN DEXTROSE 0.75-8.25 % IT SOLN
INTRATHECAL | Status: DC | PRN
  Administered 2023-09-22 (×2): 1.8 mL via INTRATHECAL

## 2023-09-22 MED ORDER — CEFAZOLIN SODIUM-DEXTROSE 2-4 GM/100ML-% IV SOLN
2.0000 g | INTRAVENOUS | Status: AC
  Administered 2023-09-22 (×2): 2 g via INTRAVENOUS
  Filled 2023-09-22: qty 100

## 2023-09-22 MED ORDER — DEXAMETHASONE SODIUM PHOSPHATE 10 MG/ML IJ SOLN
8.0000 mg | Freq: Once | INTRAMUSCULAR | Status: AC
Start: 2023-09-22 — End: 2023-09-22
  Administered 2023-09-22 (×2): 8 mg via INTRAVENOUS

## 2023-09-22 MED ORDER — ONDANSETRON HCL 4 MG/2ML IJ SOLN
INTRAMUSCULAR | Status: AC
  Filled 2023-09-22: qty 2

## 2023-09-22 MED ORDER — PROPOFOL 1000 MG/100ML IV EMUL
INTRAVENOUS | Status: AC
Start: 2023-09-22 — End: 2023-09-22
  Filled 2023-09-22: qty 100

## 2023-09-22 MED ORDER — ONDANSETRON HCL 4 MG/2ML IJ SOLN: 4.0000 mg | Freq: Four times a day (QID) | INTRAMUSCULAR | Status: DC | PRN

## 2023-09-22 MED ORDER — FENTANYL CITRATE PF 50 MCG/ML IJ SOSY: 25.0000 ug | PREFILLED_SYRINGE | INTRAMUSCULAR | Status: DC | PRN

## 2023-09-22 MED ORDER — METHOCARBAMOL 1000 MG/10ML IJ SOLN: 500.0000 mg | Freq: Four times a day (QID) | INTRAMUSCULAR | Status: DC | PRN

## 2023-09-22 MED ORDER — ATORVASTATIN CALCIUM 10 MG PO TABS
10.0000 mg | ORAL_TABLET | Freq: Every day | ORAL | Status: DC
  Administered 2023-09-23 (×2): 10 mg via ORAL
  Filled 2023-09-22: qty 1

## 2023-09-22 MED ORDER — LACTATED RINGERS IV SOLN: INTRAVENOUS | Status: DC

## 2023-09-22 MED ORDER — ACETAMINOPHEN 10 MG/ML IV SOLN
INTRAVENOUS | Status: DC | PRN
  Administered 2023-09-22 (×2): 1000 mg via INTRAVENOUS

## 2023-09-22 MED ORDER — TRANEXAMIC ACID 650 MG PO TABS
1300.0000 mg | ORAL_TABLET | Freq: Three times a day (TID) | ORAL | Status: DC
  Administered 2023-09-22 – 2023-09-23 (×4): 1300 mg via ORAL
  Filled 2023-09-22 (×2): qty 2

## 2023-09-22 MED ORDER — AMISULPRIDE (ANTIEMETIC) 5 MG/2ML IV SOLN: 10.0000 mg | Freq: Once | INTRAVENOUS | Status: DC | PRN

## 2023-09-22 MED ORDER — ALBUMIN HUMAN 5 % IV SOLN: INTRAVENOUS | Status: DC | PRN

## 2023-09-22 MED ORDER — ONDANSETRON HCL 4 MG/2ML IJ SOLN
INTRAMUSCULAR | Status: DC | PRN
  Administered 2023-09-22 (×2): 4 mg via INTRAVENOUS

## 2023-09-22 MED ORDER — SODIUM CHLORIDE (PF) 0.9 % IJ SOLN
INTRAMUSCULAR | Status: AC
  Filled 2023-09-22: qty 30

## 2023-09-22 MED ORDER — OXYCODONE HCL 5 MG PO TABS
5.0000 mg | ORAL_TABLET | ORAL | Status: DC | PRN
  Administered 2023-09-22 (×2): 5 mg via ORAL
  Filled 2023-09-22: qty 1

## 2023-09-22 MED ORDER — SODIUM CHLORIDE 0.9 % IV SOLN: INTRAVENOUS | Status: DC

## 2023-09-22 MED ORDER — BISACODYL 10 MG RE SUPP: 10.0000 mg | Freq: Every day | RECTAL | Status: DC | PRN

## 2023-09-22 MED ORDER — ACETAMINOPHEN 500 MG PO TABS
1000.0000 mg | ORAL_TABLET | Freq: Four times a day (QID) | ORAL | Status: DC
  Administered 2023-09-22 – 2023-09-23 (×6): 1000 mg via ORAL
  Filled 2023-09-22 (×3): qty 2

## 2023-09-22 MED ORDER — FLECAINIDE ACETATE 50 MG PO TABS
50.0000 mg | ORAL_TABLET | Freq: Two times a day (BID) | ORAL | Status: DC
  Administered 2023-09-22 – 2023-09-23 (×4): 50 mg via ORAL
  Filled 2023-09-22 (×2): qty 1

## 2023-09-22 MED ORDER — HYDROMORPHONE HCL 1 MG/ML IJ SOLN: 0.5000 mg | INTRAMUSCULAR | Status: DC | PRN

## 2023-09-22 MED ORDER — ORAL CARE MOUTH RINSE: 15.0000 mL | Freq: Once | OROMUCOSAL | Status: AC

## 2023-09-22 MED ORDER — MENTHOL 3 MG MT LOZG: 1.0000 | LOZENGE | OROMUCOSAL | Status: DC | PRN

## 2023-09-22 MED ORDER — POLYETHYLENE GLYCOL 3350 17 G PO PACK
17.0000 g | PACK | Freq: Two times a day (BID) | ORAL | Status: DC
  Administered 2023-09-22 – 2023-09-23 (×4): 17 g via ORAL
  Filled 2023-09-22 (×2): qty 1

## 2023-09-22 MED ORDER — PHENYLEPHRINE HCL-NACL 20-0.9 MG/250ML-% IV SOLN
INTRAVENOUS | Status: DC | PRN
  Administered 2023-09-22 (×2): 30 ug/min via INTRAVENOUS

## 2023-09-22 MED ORDER — ASPIRIN 81 MG PO CHEW
81.0000 mg | CHEWABLE_TABLET | Freq: Two times a day (BID) | ORAL | Status: DC
  Administered 2023-09-22 – 2023-09-23 (×4): 81 mg via ORAL
  Filled 2023-09-22 (×2): qty 1

## 2023-09-22 MED ORDER — ONDANSETRON HCL 4 MG PO TABS: 4.0000 mg | ORAL_TABLET | Freq: Four times a day (QID) | ORAL | Status: DC | PRN

## 2023-09-22 MED ORDER — KETOROLAC TROMETHAMINE 30 MG/ML IJ SOLN
INTRAMUSCULAR | Status: AC
Start: 2023-09-22 — End: 2023-09-22
  Filled 2023-09-22: qty 1

## 2023-09-22 MED ORDER — BUPIVACAINE-EPINEPHRINE (PF) 0.25% -1:200000 IJ SOLN
INTRAMUSCULAR | Status: AC
  Filled 2023-09-22: qty 30

## 2023-09-22 MED ORDER — SENNA 8.6 MG PO TABS
2.0000 | ORAL_TABLET | Freq: Every day | ORAL | Status: DC
  Administered 2023-09-22 (×2): 17.2 mg via ORAL
  Filled 2023-09-22: qty 2

## 2023-09-22 SURGICAL SUPPLY — 38 items
BAG COUNTER SPONGE SURGICOUNT (BAG) IMPLANT
BAG ZIPLOCK 12X15 (MISCELLANEOUS) ×1 IMPLANT
BLADE SAG 18X100X1.27 (BLADE) ×1 IMPLANT
COVER PERINEAL POST (MISCELLANEOUS) ×1 IMPLANT
COVER SURGICAL LIGHT HANDLE (MISCELLANEOUS) ×1 IMPLANT
CUP ACET PINNCLE SECTR II 62MM (Hips) IMPLANT
DERMABOND ADVANCED .7 DNX12 (GAUZE/BANDAGES/DRESSINGS) ×1 IMPLANT
DRAPE FOOT SWITCH (DRAPES) ×1 IMPLANT
DRAPE STERI IOBAN 125X83 (DRAPES) ×1 IMPLANT
DRAPE U-SHAPE 47X51 STRL (DRAPES) ×2 IMPLANT
DRESSING AQUACEL AG SP 3.5X10 (GAUZE/BANDAGES/DRESSINGS) ×1 IMPLANT
DRSG AQUACEL AG ADV 3.5X10 (GAUZE/BANDAGES/DRESSINGS) IMPLANT
DURAPREP 26ML APPLICATOR (WOUND CARE) ×1 IMPLANT
ELECT REM PT RETURN 15FT ADLT (MISCELLANEOUS) ×1 IMPLANT
GLOVE BIO SURGEON STRL SZ 6 (GLOVE) ×1 IMPLANT
GLOVE BIOGEL PI IND STRL 6.5 (GLOVE) ×1 IMPLANT
GLOVE BIOGEL PI IND STRL 7.5 (GLOVE) ×1 IMPLANT
GLOVE ORTHO TXT STRL SZ7.5 (GLOVE) ×2 IMPLANT
GOWN STRL REUS W/ TWL LRG LVL3 (GOWN DISPOSABLE) ×2 IMPLANT
HEAD CERAMIC 36 PLUS5 (Hips) IMPLANT
HOLDER FOLEY CATH W/STRAP (MISCELLANEOUS) ×1 IMPLANT
KIT TURNOVER KIT A (KITS) ×1 IMPLANT
LINER NEUTRAL 62MMC36MM P4 (Liner) IMPLANT
MANIFOLD NEPTUNE II (INSTRUMENTS) ×1 IMPLANT
NDL SAFETY ECLIPSE 18X1.5 (NEEDLE) ×1 IMPLANT
PACK ANTERIOR HIP CUSTOM (KITS) ×1 IMPLANT
PENCIL SMOKE EVACUATOR (MISCELLANEOUS) ×1 IMPLANT
SCREW 6.5MMX25MM (Screw) IMPLANT
STEM FEMORAL SZ11 HIGH ACTIS (Stem) IMPLANT
SUT MNCRL AB 4-0 PS2 18 (SUTURE) ×1 IMPLANT
SUT VIC AB 1 CT1 36 (SUTURE) ×3 IMPLANT
SUT VIC AB 2-0 CT1 TAPERPNT 27 (SUTURE) ×2 IMPLANT
SUTURE STRATFX 0 PDS 27 VIOLET (SUTURE) ×1 IMPLANT
SYR 3ML LL SCALE MARK (SYRINGE) ×1 IMPLANT
TOWEL GREEN STERILE FF (TOWEL DISPOSABLE) ×1 IMPLANT
TRAY FOLEY MTR SLVR 16FR STAT (SET/KITS/TRAYS/PACK) ×1 IMPLANT
TUBE SUCTION HIGH CAP CLEAR NV (SUCTIONS) ×1 IMPLANT
WATER STERILE IRR 1000ML POUR (IV SOLUTION) ×1 IMPLANT

## 2023-09-22 NOTE — Anesthesia Procedure Notes (Signed)
 Procedure Name: MAC Date/Time: 09/22/2023 1:17 PM  Performed by: Landy Chip HERO, CRNAPre-anesthesia Checklist: Patient identified, Emergency Drugs available, Suction available, Patient being monitored and Timeout performed Patient Re-evaluated:Patient Re-evaluated prior to induction Oxygen Delivery Method: Simple face mask Preoxygenation: Pre-oxygenation with 100% oxygen Induction Type: IV induction Placement Confirmation: positive ETCO2 Dental Injury: Teeth and Oropharynx as per pre-operative assessment

## 2023-09-22 NOTE — Interval H&P Note (Signed)
 History and Physical Interval Note:  09/22/2023 12:00 PM  Stephen Roy  has presented today for surgery, with the diagnosis of Right hip osteoarthritis.  The various methods of treatment have been discussed with the patient and family. After consideration of risks, benefits and other options for treatment, the patient has consented to  Procedure(s): ARTHROPLASTY, HIP, TOTAL, ANTERIOR APPROACH (Right) as a surgical intervention.  The patient's history has been reviewed, patient examined, no change in status, stable for surgery.  I have reviewed the patient's chart and labs.  Questions were answered to the patient's satisfaction.     Stephen Roy

## 2023-09-22 NOTE — Anesthesia Procedure Notes (Addendum)
 Spinal  Patient location during procedure: OR Start time: 09/22/2023 1:19 PM End time: 09/22/2023 1:24 PM Reason for block: surgical anesthesia Staffing Performed: anesthesiologist  Anesthesiologist: Epifanio Charleston, MD Performed by: Epifanio Charleston, MD Authorized by: Epifanio Charleston, MD   Preanesthetic Checklist Completed: patient identified, IV checked, site marked, risks and benefits discussed, surgical consent, monitors and equipment checked, pre-op evaluation and timeout performed Spinal Block Patient position: sitting Prep: DuraPrep Patient monitoring: heart rate, cardiac monitor, continuous pulse ox and blood pressure Approach: midline Location: L4-5 Injection technique: single-shot Needle Needle type: Pencan  Needle gauge: 24 G Needle length: 9 cm Assessment Sensory level: T4 Events: CSF return

## 2023-09-22 NOTE — Op Note (Signed)
 NAME:  Stephen Roy                ACCOUNT NO.: [id number]      MEDICAL RECORD NO.: [id number]      FACILITY:  [facility]      PHYSICIAN:  Donnice JONETTA Car  DATE OF BIRTH:  [birth year]     DATE OF PROCEDURE:  09/22/2023                                 OPERATIVE REPORT         PREOPERATIVE DIAGNOSIS: Right  hip osteoarthritis.      POSTOPERATIVE DIAGNOSIS:  Right hip osteoarthritis.      PROCEDURE:  Right total hip replacement through an anterior approach   utilizing DePuy THR system, component size 62 mm pinnacle cup, a size 36+4 neutral   Altrex liner, a size 11 Hi Actis stem with a 36+5 delta ceramic   ball.      SURGEON:  Donnice JONETTA. Car, M.D.      ASSISTANT:  Rosina Calin, PA-C     ANESTHESIA:  Spinal.      SPECIMENS:  None.      COMPLICATIONS:  None.      BLOOD LOSS:  1000 cc     DRAINS:  None.      INDICATION OF THE PROCEDURE:  Stephen Roy is a 78 y.o. male who had   presented to office for evaluation of right hip pain.  Radiographs revealed   progressive degenerative changes with bone-on-bone   articulation of the  hip joint, including subchondral cystic changes and osteophytes.  The patient had painful limited range of   motion significantly affecting their overall quality of life and function.  The patient was failing to    respond to conservative measures including medications and/or injections and activity modification and at this point was ready   to proceed with more definitive measures.  Consent was obtained for   benefit of pain relief.  Specific risks of infection, DVT, component   failure, dislocation, neurovascular injury, and need for revision surgery were reviewed in the office.     PROCEDURE IN DETAIL:  The patient was brought to operative theater.   Once adequate anesthesia, preoperative antibiotics, 2 gm of Ancef , 1 gm of Tranexamic Acid , and 10 mg of Decadron  were administered, the patient was positioned supine on the [employer] table.  Once the patient was safely positioned with adequate padding of boney prominences we predraped out the hip, and used fluoroscopy to confirm orientation of the pelvis.      The right hip was then prepped and draped from proximal iliac crest to   mid thigh with a shower curtain technique.      Time-out was performed identifying the patient, planned procedure, and the appropriate extremity.     An incision was then made 2 cm lateral to the   anterior superior iliac spine extending over the orientation of the   tensor fascia lata muscle and sharp dissection was carried down to the   fascia of the muscle.      The fascia was then incised.  The muscle belly was identified and swept   laterally and retractor placed along the superior neck.  Following   cauterization of the circumflex vessels and removing some pericapsular   fat, a second cobra retractor was placed on the inferior  neck.  A T-capsulotomy was made along the line of the   superior neck to the trochanteric fossa, then extended proximally and   distally.  Tag sutures were placed and the retractors were then placed   intracapsular.  We then identified the trochanteric fossa and   orientation of my neck cut and then made a neck osteotomy with the femur on traction.  The femoral   head was removed without difficulty or complication.  Traction was let   off and retractors were placed posterior and anterior around the   acetabulum.      The labrum and foveal tissue were debrided.  I began reaming with a 51 mm   reamer and reamed up to 61 mm reamer with good bony bed preparation and a 62 mm  cup was chosen.  The final 62 mm Pinnacle cup was then impacted under fluoroscopy to confirm the depth of penetration and orientation with respect to   Abduction and forward flexion.  A screw was placed into the ilium followed by the hole eliminator.  The final   36+4 neutral Altrex liner was impacted with good visualized rim fit.  The cup  was positioned anatomically within the acetabular portion of the pelvis.      At this point, the femur was rolled to 100 degrees.  Further capsule was   released off the inferior aspect of the femoral neck.  I then   released the superior capsule proximally.  With the leg in a neutral position the hook was placed laterally   along the femur under the vastus lateralis origin and elevated manually and then held in position using the hook attachment on the bed.  The leg was then extended and adducted with the leg rolled to 100   degrees of external rotation.  Retractors were placed along the medial calcar and posteriorly over the greater trochanter.  Once the proximal femur was fully   exposed, I used a box osteotome to set orientation.  I then began   broaching with the starting chili pepper broach and passed this by hand and then broached up to 11.  With the 11 broach in place I chose a high offset neck and did several trial reductions.  The offset was appropriate, leg lengths   appeared to be equal best matched with the +5 head ball trial confirmed radiographically.   Given these findings, I went ahead and dislocated the hip, repositioned all   retractors and positioned the right hip in the extended and abducted position.  The final 11 Hi Actis stem was   chosen and it was impacted down to the level of neck cut.  Based on this   and the trial reductions, a final 36+5 delta ceramic ball was chosen and   impacted onto a clean and dry trunnion, and the hip was reduced.  The   hip had been irrigated throughout the case again at this point.  I did   reapproximate the superior capsular leaflet to the anterior leaflet   using #1 Vicryl.  The fascia of the   tensor fascia lata muscle was then reapproximated using #1 Vicryl and #0 Stratafix sutures.  The   remaining wound was closed with 2-0 Vicryl and running 4-0 Monocryl.   The hip was cleaned, dried, and dressed sterilely using Dermabond and    Aquacel dressing.  The patient was then brought   to recovery room in stable condition tolerating the procedure well.    Randine  Shuford, PA-C was present for the entirety of the case involved from   preoperative positioning, perioperative retractor management, general   facilitation of the case, as well as primary wound closure as assistant.            Donnice CORDOBA Ernie, M.D.        09/22/2023 12:00 PM

## 2023-09-22 NOTE — Discharge Instructions (Addendum)
 Dr. Donnice Car Total Joint Specialist [facility] [place] [address] [place], [place] [address] [phone]  Pain Regimen Instructions:  - Take tylenol  1000 mg every 6-8 hours around the clock  - Take the muscle relaxant around the clock  - Then take 5 mg (1 tablet) of oxycodone  every 4 hours as needed for severe pain  - Ice and elevate your leg as often as you can  Do not take over the counter pain medication until instructed by us .  If this is not controlling your pain, or you need refills - call our office at [phone] or send a message via the [employer] portal.   Take the stool softeners provided until you are having regular bowel movements, and then you may discontinue.    INSTRUCTIONS AFTER JOINT REPLACEMENT   Remove items at home which could result in a fall. This includes throw rugs or furniture in walking pathways ICE to the affected joint every three hours while awake for 30 minutes at a time, for at least the first 3-5 days, and then as needed for pain and swelling.  Continue to use ice for pain and swelling. You may notice swelling that will progress down to the foot and ankle.  This is normal after surgery.  Elevate your leg when you are not up walking on it.   Continue to use the breathing machine you got in the hospital (incentive spirometer) which will help keep your temperature down.  It is common for your temperature to cycle up and down following surgery, especially at night when you are not up moving around and exerting yourself.  The breathing machine keeps your lungs expanded and your temperature down.   DIET:  As you were doing prior to hospitalization, we recommend a well-balanced diet.  DRESSING / WOUND CARE / SHOWERING  Keep the surgical dressing until follow up.  The dressing is water  proof, so you can shower without any extra covering.  IF THE DRESSING FALLS OFF or the wound gets wet inside, change the dressing with sterile  gauze.  Please use good hand washing techniques before changing the dressing.  Do not use any lotions or creams on the incision until instructed by your surgeon.    ACTIVITY  Increase activity slowly as tolerated, but follow the weight bearing instructions below.   No driving for 6 weeks or until further direction given by your physician.  You cannot drive while taking narcotics.  No lifting or carrying greater than 10 lbs. until further directed by your surgeon. Avoid periods of inactivity such as sitting longer than an hour when not asleep. This helps prevent blood clots.  You may return to work once you are authorized by your doctor.     WEIGHT BEARING   Weight bearing as tolerated with assist device (walker, cane, etc) as directed, use it as long as suggested by your surgeon or therapist, typically at least 4-6 weeks.   EXERCISES  Results after joint replacement surgery are often greatly improved when you follow the exercise, range of motion and muscle strengthening exercises prescribed by your doctor. Safety measures are also important to protect the joint from further injury. Any time any of these exercises cause you to have increased pain or swelling, decrease what you are doing until you are comfortable again and then slowly increase them. If you have problems or questions, call your caregiver or physical therapist for advice.   Rehabilitation is important following a joint replacement. After  just a few days of immobilization, the muscles of the leg can become weakened and shrink (atrophy).  These exercises are designed to build up the tone and strength of the thigh and leg muscles and to improve motion. Often times heat used for twenty to thirty minutes before working out will loosen up your tissues and help with improving the range of motion but do not use heat for the first two weeks following surgery (sometimes heat can increase post-operative swelling).   These exercises can be  done on a training (exercise) mat, on the floor, on a table or on a bed. Use whatever works the best and is most comfortable for you.    Use music or television while you are exercising so that the exercises are a pleasant break in your day. This will make your life better with the exercises acting as a break in your routine that you can look forward to.   Perform all exercises about fifteen times, three times per day or as directed.  You should exercise both the operative leg and the other leg as well.  Exercises include:   Quad Sets - Tighten up the muscle on the front of the thigh (Quad) and hold for 5-10 seconds.   Straight Leg Raises - With your knee straight (if you were given a brace, keep it on), lift the leg to 60 degrees, hold for 3 seconds, and slowly lower the leg.  Perform this exercise against resistance later as your leg gets stronger.  Leg Slides: Lying on your back, slowly slide your foot toward your buttocks, bending your knee up off the floor (only go as far as is comfortable). Then slowly slide your foot back down until your leg is flat on the floor again.  Angel Wings: Lying on your back spread your legs to the side as far apart as you can without causing discomfort.  Hamstring Strength:  Lying on your back, push your heel against the floor with your leg straight by tightening up the muscles of your buttocks.  Repeat, but this time bend your knee to a comfortable angle, and push your heel against the floor.  You may put a pillow under the heel to make it more comfortable if necessary.   A rehabilitation program following joint replacement surgery can speed recovery and prevent re-injury in the future due to weakened muscles. Contact your doctor or a physical therapist for more information on knee rehabilitation.    CONSTIPATION  Constipation is defined medically as fewer than three stools per week and severe constipation as less than one stool per week.  Even if you have a regular  bowel pattern at home, your normal regimen is likely to be disrupted due to multiple reasons following surgery.  Combination of anesthesia, postoperative narcotics, change in appetite and fluid intake all can affect your bowels.   YOU MUST use at least one of the following options; they are listed in order of increasing strength to get the job done.  They are all available over the counter, and you may need to use some, POSSIBLY even all of these options:    Drink plenty of fluids (prune juice may be helpful) and high fiber foods Colace 100 mg by mouth twice a day  Senokot for constipation as directed and as needed Dulcolax (bisacodyl ), take with full glass of water   Miralax  (polyethylene glycol) once or twice a day as needed.  If you have tried all these things and are unable to  have a bowel movement in the first 3-4 days after surgery call either your surgeon or your primary doctor.    If you experience loose stools or diarrhea, hold the medications until you stool forms back up.  If your symptoms do not get better within 1 week or if they get worse, check with your doctor.  If you experience the worst abdominal pain ever or develop nausea or vomiting, please contact the office immediately for further recommendations for treatment.   ITCHING:  If you experience itching with your medications, try taking only a single pain pill, or even half a pain pill at a time.  You can also use Benadryl  over the counter for itching or also to help with sleep.   TED HOSE STOCKINGS:  Use stockings on both legs until for at least 2 weeks or as directed by physician office. They may be removed at night for sleeping.  MEDICATIONS:  See your medication summary on the "After Visit Summary" that nursing will review with you.  You may have some home medications which will be placed on hold until you complete the course of blood thinner medication.  It is important for you to complete the blood thinner medication as  prescribed.  PRECAUTIONS:  If you experience chest pain or shortness of breath - call 911 immediately for transfer to the hospital emergency department.   If you develop a fever greater that 101 F, purulent drainage from wound, increased redness or drainage from wound, foul odor from the wound/dressing, or calf pain - CONTACT YOUR SURGEON.                                                   FOLLOW-UP APPOINTMENTS:  If you do not already have a post-op appointment, please call the office for an appointment to be seen by your surgeon.  Guidelines for how soon to be seen are listed in your "After Visit Summary", but are typically between 1-4 weeks after surgery.  OTHER INSTRUCTIONS:   Knee Replacement:  Do not place pillow under knee, focus on keeping the knee straight while resting. CPM instructions: 0-90 degrees, 2 hours in the morning, 2 hours in the afternoon, and 2 hours in the evening. Place foam block, curve side up under heel at all times except when in CPM or when walking.  DO NOT modify, tear, cut, or change the foam block in any way.  POST-OPERATIVE OPIOID TAPER INSTRUCTIONS: It is important to wean off of your opioid medication as soon as possible. If you do not need pain medication after your surgery it is ok to stop day one. Opioids include: Codeine, Hydrocodone(Norco, Vicodin), Oxycodone (Percocet, oxycontin ) and hydromorphone  amongst others.  Long term and even short term use of opiods can cause: Increased pain response Dependence Constipation Depression Respiratory depression And more.  Withdrawal symptoms can include Flu like symptoms Nausea, vomiting And more Techniques to manage these symptoms Hydrate well Eat regular healthy meals Stay active Use relaxation techniques(deep breathing, meditating, yoga) Do Not substitute Alcohol to help with tapering If you have been on opioids for less than two weeks and do not have pain than it is ok to stop all together.  Plan to  wean off of opioids This plan should start within one week post op of your joint replacement. Maintain the same interval or time between  taking each dose and first decrease the dose.  Cut the total daily intake of opioids by one tablet each day Next start to increase the time between doses. The last dose that should be eliminated is the evening dose.   MAKE SURE YOU:  Understand these instructions.  Get help right away if you are not doing well or get worse.    Thank you for letting us  be a part of your medical care team.  It is a privilege we respect greatly.  We hope these instructions will help you stay on track for a fast and full recovery!

## 2023-09-22 NOTE — Transfer of Care (Signed)
 Immediate Anesthesia Transfer of Care Note  Patient: Stephen Roy  Procedure(s) Performed: ARTHROPLASTY, HIP, TOTAL, ANTERIOR APPROACH (Right: Hip)  Patient Location: PACU  Anesthesia Type:Spinal  Level of Consciousness: awake, alert , and oriented  Airway & Oxygen Therapy: Patient Spontanous Breathing and Patient connected to face mask oxygen  Post-op Assessment: Report given to RN and Post -op Vital signs reviewed and stable  Post vital signs: Reviewed and stable  Last Vitals:  Vitals Value Taken Time  BP    Temp    Pulse    Resp    SpO2      Last Pain:  Vitals:   09/22/23 1213  TempSrc:   PainSc: 0-No pain         Complications: No notable events documented.

## 2023-09-22 NOTE — Care Plan (Signed)
 Ortho Bundle Case Management Note  Patient Details  Name: Stephen Roy MRN: [id number] Date of Birth: 06-25-1945  R THA on 09/22/23   ? DCP: Home with wife DME: No needs. Has RW and cane PT: HEP                   DME Arranged:  N/A DME Agency:  NA  HH Arranged:    HH Agency:     Additional Comments: Please contact me with any questions of if this plan should need to change.  Lyle Pepper, [place] [facility] [phone]   09/22/2023, 12:52 PM

## 2023-09-22 NOTE — Anesthesia Preprocedure Evaluation (Signed)
 Anesthesia Evaluation  Patient identified by MRN, date of birth, ID band Patient awake    Reviewed: Allergy & Precautions, NPO status , Patient's Chart, lab work & pertinent test results  Airway Mallampati: II  TM Distance: >3 FB Neck ROM: Full    Dental no notable dental hx.    Pulmonary neg pulmonary ROS   Pulmonary exam normal        Cardiovascular hypertension, Pt. on medications Normal cardiovascular exam  PVC's   Neuro/Psych negative neurological ROS  negative psych ROS   GI/Hepatic negative GI ROS,,,(+)     substance abuse    Endo/Other  negative endocrine ROS    Renal/GU negative Renal ROS     Musculoskeletal  (+) Arthritis ,    Abdominal   Peds  Hematology  (+) Blood dyscrasia, anemia   Anesthesia Other Findings Left hip osteoarthritis  Reproductive/Obstetrics                              Anesthesia Physical Anesthesia Plan  ASA: 2  Anesthesia Plan: Spinal   Post-op Pain Management: Ofirmev  IV (intra-op)*   Induction: Intravenous  PONV Risk Score and Plan: 1 and Ondansetron , Dexamethasone , Propofol  infusion and Treatment may vary due to age or medical condition  Airway Management Planned: Simple Face Mask  Additional Equipment:   Intra-op Plan:   Post-operative Plan:   Informed Consent: I have reviewed the patients History and Physical, chart, labs and discussed the procedure including the risks, benefits and alternatives for the proposed anesthesia with the patient or authorized representative who has indicated his/her understanding and acceptance.     Dental advisory given  Plan Discussed with: CRNA  Anesthesia Plan Comments:         Anesthesia Quick Evaluation

## 2023-09-22 NOTE — Anesthesia Postprocedure Evaluation (Signed)
 Anesthesia Post Note  Patient: Stephen Roy  Procedure(s) Performed: ARTHROPLASTY, HIP, TOTAL, ANTERIOR APPROACH (Right: Hip)     Patient location during evaluation: PACU Anesthesia Type: Spinal Level of consciousness: awake and alert Pain management: pain level controlled Vital Signs Assessment: post-procedure vital signs reviewed and stable Respiratory status: spontaneous breathing and respiratory function stable Cardiovascular status: blood pressure returned to baseline and stable Postop Assessment: spinal receding Anesthetic complications: no   No notable events documented.  Last Vitals:  Vitals:   09/22/23 1646 09/22/23 1649  BP: 129/67 133/72  Pulse: (!) 45 (!) 51  Resp: 16   Temp: (!) 36.4 C   SpO2: 99% 99%    Last Pain:  Vitals:   09/22/23 1646  TempSrc: Oral  PainSc:                  [place] Espina E

## 2023-09-23 ENCOUNTER — Encounter (HOSPITAL_COMMUNITY): Payer: Self-pay | Admitting: Orthopedic Surgery

## 2023-09-23 DIAGNOSIS — Z7982 Long term (current) use of aspirin: Secondary | ICD-10-CM | POA: Diagnosis not present

## 2023-09-23 DIAGNOSIS — Z96641 Presence of right artificial hip joint: Secondary | ICD-10-CM | POA: Diagnosis not present

## 2023-09-23 DIAGNOSIS — I10 Essential (primary) hypertension: Secondary | ICD-10-CM | POA: Diagnosis not present

## 2023-09-23 DIAGNOSIS — M1611 Unilateral primary osteoarthritis, right hip: Secondary | ICD-10-CM | POA: Diagnosis not present

## 2023-09-23 LAB — BASIC METABOLIC PANEL WITH GFR
Anion gap: 6 (ref 5–15)
BUN: 25 mg/dL — ABNORMAL HIGH (ref 8–23)
CO2: 22 mmol/L (ref 22–32)
Calcium: 8.7 mg/dL — ABNORMAL LOW (ref 8.9–10.3)
Chloride: 108 mmol/L (ref 98–111)
Creatinine, Ser: 1.33 mg/dL — ABNORMAL HIGH (ref 0.61–1.24)
GFR, Estimated: 55 mL/min — ABNORMAL LOW (ref >60)
Glucose, Bld: 159 mg/dL — ABNORMAL HIGH (ref 70–99)
Potassium: 4.7 mmol/L (ref 3.5–5.1)
Sodium: 136 mmol/L (ref 135–145)

## 2023-09-23 LAB — CBC
HCT: 28 % — ABNORMAL LOW (ref 39.0–52.0)
Hemoglobin: 9.4 g/dL — ABNORMAL LOW (ref 13.0–17.0)
MCH: 32.3 pg (ref 26.0–34.0)
MCHC: 33.6 g/dL (ref 30.0–36.0)
MCV: 96.2 fL (ref 80.0–100.0)
Platelets: 133 K/uL — ABNORMAL LOW (ref 150–400)
RBC: 2.91 MIL/uL — ABNORMAL LOW (ref 4.22–5.81)
RDW: 12.5 % (ref 11.5–15.5)
WBC: 12.8 K/uL — ABNORMAL HIGH (ref 4.0–10.5)
nRBC: 0 % (ref 0.0–0.2)

## 2023-09-23 MED ORDER — ASPIRIN 81 MG PO CHEW: 81.0000 mg | CHEWABLE_TABLET | Freq: Two times a day (BID) | ORAL | Status: AC

## 2023-09-23 NOTE — Evaluation (Signed)
 Physical Therapy Evaluation Patient Details Name: Stephen Roy MRN: [id number] DOB: 08/22/1945 Today's Date: 09/23/2023  History of Present Illness  78 yo male presents s/p R THA on 09/22/23 to therapy due to failure of conservative measures.  PMH includes but is not limited to: HTN, HDL, bradycardia, dizziness PE and L shoulder fx, LTKA, L THA  Clinical Impression  Patient evaluated by Physical Therapy with no further acute PT needs identified. All education has been completed and the patient has no further questions.  Mobility as below, pt requested THA ex handout although states he is familiar from recent THA. Provided handout and progression tips. Pt is very independent and active at his baseline. Feels ready to d/c home with spouse assist as needed.   See below for any follow-up Physical Therapy or equipment needs. PT is signing off. Thank you for this referral.         If plan is discharge home, recommend the following: Assistance with cooking/housework;Help with stairs or ramp for entrance   Can travel by private vehicle        Equipment Recommendations None recommended by PT  Recommendations for Other Services       Functional Status Assessment       Precautions / Restrictions Precautions Precautions: Fall Restrictions Weight Bearing Restrictions Per Provider Order: No RLE Weight Bearing Per Provider Order: Weight bearing as tolerated      Mobility  Bed Mobility Overal bed mobility: Needs Assistance Bed Mobility: (P) Supine to Sit     Supine to sit: Supervision, HOB elevated     General bed mobility comments: supervision for safety  , no physical assist    Transfers Overall transfer level: Needs assistance Equipment used: Rolling walker (2 wheels) Transfers: Sit to/from Stand Sit to Stand: Contact guard assist, Supervision           General transfer comment: cues for hand placement    Ambulation/Gait Ambulation/Gait assistance: Supervision Gait  Distance (Feet): 120 Feet Assistive device: Rolling walker (2 wheels) Gait Pattern/deviations: Step-to pattern, Step-through pattern Gait velocity: decr but functional     General Gait Details: progression to step through, good stability, no LOB. cues for RW position and trunk extension  Stairs Stairs: Yes Stairs assistance: Supervision, Contact guard assist Stair Management: Step to pattern, Forwards, With cane Number of Stairs: 2 General stair comments: reviewed up with cane and rail, down with bil rails simulating home entry up, flight to 2nd level on descent; pt did well, no LOB; self cues proper technique from prior surgeries  Wheelchair Mobility     Tilt Bed    Modified Rankin (Stroke Patients Only)       Balance Overall balance assessment: No apparent balance deficits (not formally assessed)                                           Pertinent Vitals/Pain Pain Assessment Pain Assessment: Faces Faces Pain Scale: Hurts a little bit Pain Location: R hip Pain Descriptors / Indicators: Tightness, Sore Pain Intervention(s): Limited activity within patient's tolerance, Monitored during session, Premedicated before session    Home Living Family/patient expects to be discharged to:: Private residence Living Arrangements: Spouse/significant other Available Help at Discharge: Family Type of Home: House Home Access: Stairs to enter Entrance Stairs-Rails: None Entrance Stairs-Number of Steps: 2 Alternate Level Stairs-Number of Steps: flight Home Layout: Two level;Able to live  on main level with bedroom/bathroom Home Equipment: Rolling Walker (2 wheels);Cane - single point;BSC/3in1 Additional Comments: pt reports going up stairs after last THA without incr pain; stayed downstairs for a few days after TJA surgery prior to that    Prior Function Prior Level of Function : Independent/Modified Independent             Mobility Comments: very active;  golfs and skis ADLs Comments: ind     Extremity/Trunk Assessment   Upper Extremity Assessment Upper Extremity Assessment: Overall WFL for tasks assessed    Lower Extremity Assessment Lower Extremity Assessment: RLE deficits/detail RLE Deficits / Details: hip flexion 3/5, limied by post op pain. knee AROM WFL, ankle WFL       Communication   Communication Communication: No apparent difficulties    Cognition Arousal: Alert Behavior During Therapy: WFL for tasks assessed/performed   PT - Cognitive impairments: No apparent impairments                         Following commands: Intact       Cueing Cueing Techniques: Verbal cues     General Comments      Exercises     Assessment/Plan    PT Assessment  (f/u HEP  only per MD)  PT Problem List         PT Treatment Interventions      PT Goals (Current goals can be found in the Care Plan section)  Acute Rehab PT Goals PT Goal Formulation: All assessment and education complete, DC therapy    Frequency       Co-evaluation               AM-PAC PT 6 Clicks Mobility  Outcome Measure Help needed turning from your back to your side while in a flat bed without using bedrails?: None Help needed moving from lying on your back to sitting on the side of a flat bed without using bedrails?: None Help needed moving to and from a bed to a chair (including a wheelchair)?: None Help needed standing up from a chair using your arms (e.g., wheelchair or bedside chair)?: None Help needed to walk in hospital room?: None Help needed climbing 3-5 steps with a railing? : A Little 6 Click Score: 23    End of Session Equipment Utilized During Treatment: Gait belt Activity Tolerance: Patient tolerated treatment well Patient left: with call bell/phone within reach;in chair;with family/visitor present Nurse Communication: Other (comment);Mobility status (ready for d/c) PT Visit Diagnosis: Other abnormalities of gait  and mobility (R26.89)    Time: 1020-1100 PT Time Calculation (min) (ACUTE ONLY): 40 min   Charges:   PT Evaluation $PT Eval Low Complexity: 1 Low PT Treatments $Gait Training: 8-22 mins PT General Charges $$ ACUTE PT VISIT: 1 Visit         Shenee Wignall, PT  Acute Rehab Dept [facility]) [phone]  09/23/2023   [facility] 09/23/2023, 3:19 PM

## 2023-09-23 NOTE — Progress Notes (Signed)
 NT assisting patient to dangle and patient had brief orthostatic episode. Patient assisted back into the bed with BLE elevated. Vital Signs: 84/58, 72, 16, 98 % (RA). Patient head of bed elevated and responsive/alert & oriented x 4 with current vital signs: 122/64, 66, 17, 97.6 (oral), 97% (RA). Patient encouraged to push fluids. Patient denies any dizziness or pain at this time. Will continue to monitor the patient.

## 2023-09-23 NOTE — Progress Notes (Signed)
   Subjective: 1 Day Post-Op Procedure(s) (LRB): ARTHROPLASTY, HIP, TOTAL, ANTERIOR APPROACH (Right) Patient reports pain as mild.   Patient seen in rounds by Dr. Ernie. Patient is resting in bed on exam this morning. No acute events overnight other than brief orthostatic episode. Foley catheter removed. Patient did not get up with PT yet.  We will start therapy today.   Objective: Vital signs in last 24 hours: Temp:  [97.2 F (36.2 C)-98.5 F (36.9 C)] 97.5 F (36.4 C) (08/13 0129) Pulse Rate:  [44-78] 63 (08/13 0645) Resp:  [11-18] 18 (08/13 0645) BP: (110-168)/(55-76) 124/66 (08/13 0645) SpO2:  [92 %-99 %] 99 % (08/13 0645) Weight:  [93 kg] 93 kg (08/12 1901)  Intake/Output from previous day:  Intake/Output Summary (Last 24 hours) at 09/23/2023 0756 Last data filed at 09/23/2023 0600 Gross per 24 hour  Intake 3330 ml  Output 2550 ml  Net 780 ml     Intake/Output this shift: No intake/output data recorded.  Labs: Recent Labs    09/23/23 0335  HGB 9.4*   Recent Labs    09/23/23 0335  WBC 12.8*  RBC 2.91*  HCT 28.0*  PLT 133*   Recent Labs    09/23/23 0335  NA 136  K 4.7  CL 108  CO2 22  BUN 25*  CREATININE 1.33*  GLUCOSE 159*  CALCIUM  8.7*   No results for input(s): LABPT, INR in the last 72 hours.  Exam: General - Patient is Alert and Oriented Extremity - Neurologically intact Sensation intact distally Intact pulses distally Dorsiflexion/Plantar flexion intact Dressing - dressing C/D/I Motor Function - intact, moving foot and toes well on exam.   Past Medical History:  Diagnosis Date   Abnormal EKG    Arthritis    Bradycardia    Dizziness    Dysrhythmia    GERD (gastroesophageal reflux disease)    Hx of blood clots    post collar bone fracture, took anticoags for 40mo after   Hyperlipidemia    Hypertension    PVC's (premature ventricular contractions)     Assessment/Plan: 1 Day Post-Op Procedure(s) (LRB): ARTHROPLASTY, HIP,  TOTAL, ANTERIOR APPROACH (Right) Principal Problem:   S/P total right hip arthroplasty  Estimated body mass index is 26.32 kg/m as calculated from the following:   Height as of this encounter: 6' 2 (1.88 m).   Weight as of this encounter: 93 kg. Advance diet Up with therapy D/C IV fluids  DVT Prophylaxis - Aspirin  Weight bearing as tolerated.  Hgb stable at 9.4 this AM Cr 1.33 - baseline  All meds sent prior to surgery, patient has at home   Plan is to go Home after hospital stay. Plan for discharge today after meeting goals with therapy. Follow up in the office in 2 weeks.   Rosina Calin, PA-C Orthopedic Surgery [phone] 09/23/2023, 7:56 AM

## 2023-09-23 NOTE — TOC Transition Note (Signed)
 Transition of Care [facility]) - Discharge Note   Patient Details  Name: Stephen Roy MRN: [id number] Date of Birth: 1945/07/02  [facility]) CM/SW Contact:  NORMAN ASPEN, LCSW Phone Number: 09/23/2023, 10:06 AM   Clinical Narrative:     Met with pt who confirms he has needed DME in the home.  Plan for HEP.  No further IP CM needs.  Final next level of care: Home/Self Care Barriers to Discharge: No Barriers Identified   Patient Goals and CMS Choice Patient states their goals for this hospitalization and ongoing recovery are:: return home          Discharge Placement                       Discharge Plan and Services Additional resources added to the After Visit Summary for                  DME Arranged: N/A DME Agency: NA                  Social Drivers of Health (SDOH) Interventions SDOH Screenings   Food Insecurity: No Food Insecurity (09/22/2023)  Housing: High Risk (09/22/2023)  Transportation Needs: No Transportation Needs (09/22/2023)  Utilities: Not At Risk (09/22/2023)  Social Connections: Socially Integrated (09/22/2023)  Tobacco Use: Low Risk  (09/22/2023)     Readmission Risk Interventions     No data to display

## 2023-09-23 NOTE — Progress Notes (Signed)
Discharge instructions given to patient and wife, questions asked and answered.Sharrell Ku Zyara Riling RN

## 2023-09-29 NOTE — Discharge Summary (Signed)
 Patient ID: Stephen Roy MRN: [id number] DOB/AGE: 08/03/1945 78 y.o.  Admit date: 09/22/2023 Discharge date: 09/23/2023  Admission Diagnoses:  Right hip osteoarthritis  Discharge Diagnoses:  Principal Problem:   S/P total right hip arthroplasty   Past Medical History:  Diagnosis Date   Abnormal EKG    Arthritis    Bradycardia    Dizziness    Dysrhythmia    GERD (gastroesophageal reflux disease)    Hx of blood clots    post collar bone fracture, took anticoags for 70mo after   Hyperlipidemia    Hypertension    PVC's (premature ventricular contractions)     Surgeries: Procedure(s): ARTHROPLASTY, HIP, TOTAL, ANTERIOR APPROACH on 09/22/2023   Consultants:   Discharged Condition: Improved  Hospital Course: Stephen Roy is an 78 y.o. male who was admitted 09/22/2023 for operative treatment ofS/P total right hip arthroplasty. Patient has severe unremitting pain that affects sleep, daily activities, and work/hobbies. After pre-op clearance the patient was taken to the operating room on 09/22/2023 and underwent  Procedure(s): ARTHROPLASTY, HIP, TOTAL, ANTERIOR APPROACH.    Patient was given perioperative antibiotics:  Anti-infectives (From admission, onward)    Start     Dose/Rate Route Frequency Ordered Stop   09/22/23 2000  ceFAZolin  (ANCEF ) IVPB 2g/100 mL premix        2 g 200 mL/hr over 30 Minutes Intravenous Every 6 hours 09/22/23 1702 09/23/23 0453   09/22/23 1130  ceFAZolin  (ANCEF ) IVPB 2g/100 mL premix        2 g 200 mL/hr over 30 Minutes Intravenous On call to O.R. 09/22/23 1125 09/22/23 1353        Patient was given sequential compression devices, early ambulation, and chemoprophylaxis to prevent DVT. Patient worked with PT and was meeting their goals regarding safe ambulation and transfers.  Patient benefited maximally from hospital stay and there were no complications.    Recent vital signs: No data found.   Recent laboratory studies: No results for  input(s): WBC, HGB, HCT, PLT, NA, K, CL, CO2, BUN, CREATININE, GLUCOSE, INR, CALCIUM  in the last 72 hours.  Invalid input(s): PT, 2   Discharge Medications:   Allergies as of 09/23/2023       Reactions   Lisinopril Other (See Comments)   impotence   Simvastatin Other (See Comments)   myalgia        Medication List     TAKE these medications    amoxicillin 500 MG tablet Commonly known as: AMOXIL Take 2,000 mg by mouth See admin instructions. Take 4 capsules (2000 mg) by mouth 1 hour prior to dental appointments.   aspirin  81 MG chewable tablet Chew 1 tablet (81 mg total) by mouth 2 (two) times daily. What changed: when to take this   atorvastatin  10 MG tablet Commonly known as: LIPITOR Take 10 mg by mouth in the morning.   FISH OIL PO Take 1 capsule by mouth in the morning.   flecainide  50 MG tablet Commonly known as: TAMBOCOR  Take 1 tablet (50 mg total) by mouth 2 (two) times daily.   irbesartan  300 MG tablet Commonly known as: Avapro  Take 1 tablet (300 mg total) by mouth daily.   multivitamin with minerals Tabs tablet Take 1 tablet by mouth daily. Multivitamin for Men   sildenafil 100 MG tablet Commonly known as: VIAGRA Take 100 mg by mouth daily as needed for erectile dysfunction.               Discharge Care Instructions  (  From admission, onward)           Start     Ordered   09/23/23 0000  Change dressing       Comments: Maintain surgical dressing until follow up in the clinic. If the edges start to pull up, may reinforce with tape. If the dressing is no longer working, may remove and cover with gauze and tape, but must keep the area dry and clean.  Call with any questions or concerns.   09/23/23 0758            Diagnostic Studies: DG Pelvis Portable Result Date: 09/22/2023 CLINICAL DATA:  Status post hip arthroplasty EXAM: PORTABLE PELVIS 1 VIEWS COMPARISON:  Pelvis radiograph dated 06/16/2023 FINDINGS:  Status post right hip arthroplasty. Hardware appears intact and well seated. Unchanged appearance of left hip arthroplasty. No acute fracture or dislocation. IMPRESSION: Status post right hip arthroplasty. No acute fracture or dislocation. Electronically Signed   By: Limin  Xu M.D.   On: 09/22/2023 16:32   DG HIP UNILAT WITH PELVIS 1V RIGHT Result Date: 09/22/2023 CLINICAL DATA:  Elective surgery. EXAM: DG HIP (WITH OR WITHOUT PELVIS) 1V RIGHT COMPARISON:  Preoperative radiograph. FINDINGS: Single fluoroscopic spot view of the pelvis and hips submitted from the operating room. Portion of right hip arthroplasty is in place. Previous left hip arthroplasty. Fluoroscopy time 10 seconds. Dose 2.04 mGy. IMPRESSION: Intraoperative fluoroscopy during right hip arthroplasty. Electronically Signed   By: Andrea Gasman M.D.   On: 09/22/2023 15:04   DG C-Arm 1-60 Min-No Report Result Date: 09/22/2023 Fluoroscopy was utilized by the requesting physician.  No radiographic interpretation.    Disposition: Discharge disposition: 01-Home or Self Care       Discharge Instructions     Call MD / Call 911   Complete by: As directed    If you experience chest pain or shortness of breath, CALL 911 and be transported to the hospital emergency room.  If you develope a fever above 101 F, pus (white drainage) or increased drainage or redness at the wound, or calf pain, call your surgeon's office.   Change dressing   Complete by: As directed    Maintain surgical dressing until follow up in the clinic. If the edges start to pull up, may reinforce with tape. If the dressing is no longer working, may remove and cover with gauze and tape, but must keep the area dry and clean.  Call with any questions or concerns.   Constipation Prevention   Complete by: As directed    Drink plenty of fluids.  Prune juice may be helpful.  You may use a stool softener, such as Colace (over the counter) 100 mg twice a day.  Use MiraLax   (over the counter) for constipation as needed.   Diet - low sodium heart healthy   Complete by: As directed    Increase activity slowly as tolerated   Complete by: As directed    Weight bearing as tolerated with assist device (walker, cane, etc) as directed, use it as long as suggested by your surgeon or therapist, typically at least 4-6 weeks.   Post-operative opioid taper instructions:   Complete by: As directed    POST-OPERATIVE OPIOID TAPER INSTRUCTIONS: It is important to wean off of your opioid medication as soon as possible. If you do not need pain medication after your surgery it is ok to stop day one. Opioids include: Codeine, Hydrocodone (Norco, Vicodin), Oxycodone (Percocet, oxycontin ) and hydromorphone  amongst others.  Long term and  even short term use of opiods can cause: Increased pain response Dependence Constipation Depression Respiratory depression And more.  Withdrawal symptoms can include Flu like symptoms Nausea, vomiting And more Techniques to manage these symptoms Hydrate well Eat regular healthy meals Stay active Use relaxation techniques(deep breathing, meditating, yoga) Do Not substitute Alcohol  to help with tapering If you have been on opioids for less than two weeks and do not have pain than it is ok to stop all together.  Plan to wean off of opioids This plan should start within one week post op of your joint replacement. Maintain the same interval or time between taking each dose and first decrease the dose.  Cut the total daily intake of opioids by one tablet each day Next start to increase the time between doses. The last dose that should be eliminated is the evening dose.      TED hose   Complete by: As directed    Use stockings (TED hose) for 2 weeks on both leg(s).  You may remove them at night for sleeping.        Follow-up Information     Ernie Cough, MD. Go on 10/07/2023.   Specialty: Orthopedic Surgery Why: You are scheduled for a  post op appointment on Wednesday 10/07/23 at 11:15am Contact information: [address] [address] [place] [address] [phone]                  Signed: Rosina JONELLE Calin 09/29/2023, 8:56 AM

## 2023-10-05 DIAGNOSIS — H2513 Age-related nuclear cataract, bilateral: Secondary | ICD-10-CM | POA: Diagnosis not present

## 2023-10-05 DIAGNOSIS — H04123 Dry eye syndrome of bilateral lacrimal glands: Secondary | ICD-10-CM | POA: Diagnosis not present

## 2023-10-07 DIAGNOSIS — Z96641 Presence of right artificial hip joint: Secondary | ICD-10-CM | POA: Diagnosis not present

## 2023-10-07 DIAGNOSIS — Z471 Aftercare following joint replacement surgery: Secondary | ICD-10-CM | POA: Diagnosis not present

## 2023-10-14 DIAGNOSIS — Z0189 Encounter for other specified special examinations: Secondary | ICD-10-CM | POA: Diagnosis not present

## 2023-10-14 DIAGNOSIS — I10 Essential (primary) hypertension: Secondary | ICD-10-CM | POA: Diagnosis not present

## 2023-10-14 DIAGNOSIS — R82998 Other abnormal findings in urine: Secondary | ICD-10-CM | POA: Diagnosis not present

## 2023-10-14 DIAGNOSIS — Z125 Encounter for screening for malignant neoplasm of prostate: Secondary | ICD-10-CM | POA: Diagnosis not present

## 2023-10-14 DIAGNOSIS — E785 Hyperlipidemia, unspecified: Secondary | ICD-10-CM | POA: Diagnosis not present

## 2023-10-21 DIAGNOSIS — H2511 Age-related nuclear cataract, right eye: Secondary | ICD-10-CM | POA: Diagnosis not present

## 2023-10-21 DIAGNOSIS — H25811 Combined forms of age-related cataract, right eye: Secondary | ICD-10-CM | POA: Diagnosis not present

## 2023-10-22 DIAGNOSIS — R001 Bradycardia, unspecified: Secondary | ICD-10-CM | POA: Diagnosis not present

## 2023-10-22 DIAGNOSIS — Z0000 Encounter for general adult medical examination without abnormal findings: Secondary | ICD-10-CM | POA: Diagnosis not present

## 2023-10-22 DIAGNOSIS — I493 Ventricular premature depolarization: Secondary | ICD-10-CM | POA: Diagnosis not present

## 2023-10-22 DIAGNOSIS — N529 Male erectile dysfunction, unspecified: Secondary | ICD-10-CM | POA: Diagnosis not present

## 2023-10-22 DIAGNOSIS — I10 Essential (primary) hypertension: Secondary | ICD-10-CM | POA: Diagnosis not present

## 2023-10-22 DIAGNOSIS — Z1331 Encounter for screening for depression: Secondary | ICD-10-CM | POA: Diagnosis not present

## 2023-10-22 DIAGNOSIS — I350 Nonrheumatic aortic (valve) stenosis: Secondary | ICD-10-CM | POA: Diagnosis not present

## 2023-10-22 DIAGNOSIS — E785 Hyperlipidemia, unspecified: Secondary | ICD-10-CM | POA: Diagnosis not present

## 2023-10-22 DIAGNOSIS — Z1339 Encounter for screening examination for other mental health and behavioral disorders: Secondary | ICD-10-CM | POA: Diagnosis not present

## 2023-10-22 DIAGNOSIS — M25559 Pain in unspecified hip: Secondary | ICD-10-CM | POA: Diagnosis not present

## 2023-10-22 DIAGNOSIS — Z23 Encounter for immunization: Secondary | ICD-10-CM | POA: Diagnosis not present

## 2023-10-26 ENCOUNTER — Encounter: Payer: Self-pay | Admitting: Gastroenterology

## 2023-11-11 DIAGNOSIS — H25812 Combined forms of age-related cataract, left eye: Secondary | ICD-10-CM | POA: Diagnosis not present

## 2023-11-11 DIAGNOSIS — H2512 Age-related nuclear cataract, left eye: Secondary | ICD-10-CM | POA: Diagnosis not present

## 2023-12-07 DIAGNOSIS — Z471 Aftercare following joint replacement surgery: Secondary | ICD-10-CM | POA: Diagnosis not present

## 2023-12-07 DIAGNOSIS — Z96641 Presence of right artificial hip joint: Secondary | ICD-10-CM | POA: Diagnosis not present

## 2023-12-07 DIAGNOSIS — Z96642 Presence of left artificial hip joint: Secondary | ICD-10-CM | POA: Diagnosis not present

## 2023-12-09 ENCOUNTER — Telehealth: Payer: Self-pay | Admitting: Cardiology

## 2023-12-09 NOTE — Telephone Encounter (Signed)
 Pt returning call

## 2023-12-09 NOTE — Telephone Encounter (Signed)
 Patient identification verified by 2 forms.  Pt was last seen on 06/04/2023 by Jodie.  Per office notes Continue flecainide  50 mg BID  Pt is requesting a paper prescription be mailed to him.   Will get message to Jodie to have this matter addressed.

## 2023-12-09 NOTE — Telephone Encounter (Signed)
 Pt would like a c/b from Dr Inocencio nurse to know if he can get a new Rx for Flecainide  please advise

## 2023-12-09 NOTE — Telephone Encounter (Signed)
 Left message for pt to call.

## 2023-12-15 ENCOUNTER — Other Ambulatory Visit: Payer: Self-pay | Admitting: *Deleted

## 2023-12-15 MED ORDER — FLECAINIDE ACETATE 50 MG PO TABS: 50.0000 mg | ORAL_TABLET | Freq: Two times a day (BID) | ORAL | 3 refills | Status: AC

## 2023-12-15 NOTE — Telephone Encounter (Signed)
 Rx printed, will have Jodie sign it tomorrow and I will mail it to patient as requested.

## 2023-12-16 ENCOUNTER — Ambulatory Visit (INDEPENDENT_AMBULATORY_CARE_PROVIDER_SITE_OTHER): Admitting: Gastroenterology

## 2023-12-16 ENCOUNTER — Encounter: Payer: Self-pay | Admitting: Gastroenterology

## 2023-12-16 VITALS — BP 122/60 | HR 63 | Ht 74.0 in | Wt 211.5 lb

## 2023-12-16 DIAGNOSIS — Z860100 Personal history of colon polyps, unspecified: Secondary | ICD-10-CM | POA: Diagnosis not present

## 2023-12-16 MED ORDER — NA SULFATE-K SULFATE-MG SULF 17.5-3.13-1.6 GM/177ML PO SOLN: 1.0000 | Freq: Once | ORAL | 0 refills | Status: AC

## 2023-12-16 NOTE — Patient Instructions (Signed)
 You have been scheduled for a colonoscopy. Please follow written instructions given to you at your visit today.   If you use inhalers (even only as needed), please bring them with you on the day of your procedure.  DO NOT TAKE 7 DAYS PRIOR TO TEST- Trulicity (dulaglutide) Ozempic, Wegovy (semaglutide) Mounjaro (tirzepatide) Bydureon Bcise (exanatide extended release)  DO NOT TAKE 1 DAY PRIOR TO YOUR TEST Rybelsus (semaglutide) Adlyxin (lixisenatide) Victoza (liraglutide) Byetta (exanatide) ___________________________________________________________________________

## 2023-12-16 NOTE — Progress Notes (Signed)
 12/16/2023 Stephen Roy [id number] 12/14/1945   Discussed the use of AI scribe software for clinical note transcription with the patient, who gave verbal consent to proceed.  History of Present Illness Stephen Roy is a 78 year old male who presents for a preoperative evaluation for a colonoscopy.  Previously a patient of Dr. Teressa.  He is here today schedule for a colonoscopy due to a history of a larger polyp found in a previous procedure. His last colonoscopy five years ago showed no polyps.   He is very active, skiing over 100 days a year and walking 18 holes of golf while carrying his bag. He has undergone two hip surgeries this year, one in May and another in August, both approved by his cardiologist. He also had cataract surgeries seven weeks ago and two weeks ago.  He plans to travel to [place]  for four and a half months after Thanksgiving and wishes to schedule the colonoscopy before then. He has regular bowel movements with no blood in the stool.  No GI concerns.  Colonoscopy 10/2018: - Diverticulosis in the left colon. - The examination was otherwise normal on direct and retroflexion views. - No polyps or cancers. - Repeat colonoscopy in 5 years for surveillance given 11mm SSA 3 years ago.   Past Medical History:  Diagnosis Date   Abnormal EKG    Arthritis    Bradycardia    Dizziness    Dysrhythmia    GERD (gastroesophageal reflux disease)    Hx of blood clots    post collar bone fracture, took anticoags for 66mo after   Hyperlipidemia    Hypertension    PVC's (premature ventricular contractions)    Past Surgical History:  Procedure Laterality Date   CATARACT EXTRACTION  2025   CLOSED REDUCTION HAND FRACTURE Right 1961   COLONOSCOPY  10/2015   jacobs hx polyps   FRACTURE SURGERY Left 2001   shoulder   TONSILLECTOMY  10/1949   TOTAL HIP ARTHROPLASTY Left 06/16/2023   Procedure: ARTHROPLASTY, HIP, TOTAL, ANTERIOR APPROACH;  Surgeon: Ernie Cough, MD;   Location: [facility];  Service: Orthopedics;  Laterality: Left;   TOTAL HIP ARTHROPLASTY Right 09/22/2023   Procedure: ARTHROPLASTY, HIP, TOTAL, ANTERIOR APPROACH;  Surgeon: Ernie Cough, MD;  Location: [facility];  Service: Orthopedics;  Laterality: Right;   TOTAL KNEE ARTHROPLASTY Left 06/24/2022   Procedure: TOTAL KNEE ARTHROPLASTY;  Surgeon: Ernie Cough, MD;  Location: [facility];  Service: Orthopedics;  Laterality: Left;    reports that he has never smoked. He has never used smokeless tobacco. He reports that he does not currently use alcohol  after a past usage of about 2.0 standard drinks of alcohol  per week. He reports that he does not use drugs. family history includes Heart disease in his father. Allergies  Allergen Reactions   Lisinopril Other (See Comments)    impotence   Simvastatin Other (See Comments)    myalgia  Other Reaction(s): myalgia      Outpatient Encounter Medications as of 12/16/2023  Medication Sig   atorvastatin  (LIPITOR) 10 MG tablet Take 10 mg by mouth in the morning.   celecoxib (CELEBREX) 200 MG capsule Take 200 mg by mouth daily.   flecainide  (TAMBOCOR ) 50 MG tablet Take 1 tablet (50 mg total) by mouth 2 (two) times daily.   hydrochlorothiazide  (MICROZIDE ) 12.5 MG capsule Take by mouth daily.   irbesartan  (AVAPRO ) 300 MG tablet Take 1 tablet (300 mg total) by mouth daily.   Multiple  Vitamin (MULTIVITAMIN WITH MINERALS) TABS tablet Take 1 tablet by mouth daily. Multivitamin for Men   Omega-3 Fatty Acids (FISH OIL PO) Take 1 capsule by mouth in the morning.   oxyCODONE  (OXY IR/ROXICODONE ) 5 MG immediate release tablet Take 5 mg by mouth 2 (two) times daily as needed. for pain   sildenafil (VIAGRA) 100 MG tablet Take 100 mg by mouth daily as needed for erectile dysfunction.   amoxicillin (AMOXIL) 500 MG tablet Take 2,000 mg by mouth See admin instructions. Take 4 capsules (2000 mg) by mouth 1 hour prior to dental appointments. (Patient not taking: Reported on  12/16/2023)   No facility-administered encounter medications on file as of 12/16/2023.    REVIEW OF SYSTEMS  : All other systems reviewed and negative except where noted in the History of Present Illness.   PHYSICAL EXAM: BP 122/60   Pulse 63   Ht 6' 2 (1.88 m)   Wt 211 lb 8 oz (95.9 kg)   BMI 27.15 kg/m  General: Well developed white male in no acute distress Head: Normocephalic and atraumatic Eyes:  Sclerae anicteric, conjunctiva pink. Ears: Normal auditory acuity Lungs: Clear throughout to auscultation; no W/R/R. Heart: Regular rate and rhythm; no M/R/G. Rectal:  Will be done at the time of colonoscopy. Musculoskeletal: Symmetrical with no gross deformities  Skin: No lesions on visible extremities Extremities: No edema  Neurological: Alert oriented x 4, grossly non-focal Psychological:  Alert and cooperative. Normal mood and affect Assessment & Plan Personal history of colon polyps: 78 year old male with no polyps on the last colonoscopy but a larger polyp previously so 5 year recall was recommended. He is medically appropriate for colonoscopy with no significant comorbidities affecting the procedure. He is active, recently underwent hip and knee surgeries, and cataract surgeries. - Scheduled colonoscopy with Dr. San as patient needed an appointment soon since he will be leaving the state for several months.  The risks, benefits, and alternatives to colonoscopy were discussed with the patient and he consents to proceed.     CC:  Stephen Hamilton, MD

## 2023-12-16 NOTE — Telephone Encounter (Signed)
 Rx has been signed and placed in outgoing mailbox to be mailed to pt.

## 2023-12-17 NOTE — Progress Notes (Signed)
 Agree with the assessment and plan as outlined by Doug Sou, PA-C. ? ?Aubriegh Minch, DO, FACG ? ?

## 2023-12-21 ENCOUNTER — Ambulatory Visit (AMBULATORY_SURGERY_CENTER): Admitting: Gastroenterology

## 2023-12-21 ENCOUNTER — Encounter: Payer: Self-pay | Admitting: Gastroenterology

## 2023-12-21 VITALS — BP 141/82 | HR 51 | Temp 97.6°F | Resp 16 | Ht 74.0 in | Wt 211.8 lb

## 2023-12-21 DIAGNOSIS — D122 Benign neoplasm of ascending colon: Secondary | ICD-10-CM

## 2023-12-21 DIAGNOSIS — I493 Ventricular premature depolarization: Secondary | ICD-10-CM | POA: Diagnosis not present

## 2023-12-21 DIAGNOSIS — Z860100 Personal history of colon polyps, unspecified: Secondary | ICD-10-CM

## 2023-12-21 DIAGNOSIS — E785 Hyperlipidemia, unspecified: Secondary | ICD-10-CM | POA: Diagnosis not present

## 2023-12-21 DIAGNOSIS — K635 Polyp of colon: Secondary | ICD-10-CM

## 2023-12-21 DIAGNOSIS — Z860101 Personal history of adenomatous and serrated colon polyps: Secondary | ICD-10-CM | POA: Diagnosis not present

## 2023-12-21 DIAGNOSIS — K5730 Diverticulosis of large intestine without perforation or abscess without bleeding: Secondary | ICD-10-CM

## 2023-12-21 DIAGNOSIS — Z1211 Encounter for screening for malignant neoplasm of colon: Secondary | ICD-10-CM

## 2023-12-21 DIAGNOSIS — I10 Essential (primary) hypertension: Secondary | ICD-10-CM | POA: Diagnosis not present

## 2023-12-21 MED ORDER — SODIUM CHLORIDE 0.9 % IV SOLN: 500.0000 mL | INTRAVENOUS | Status: DC

## 2023-12-21 NOTE — Progress Notes (Signed)
 Report given to PACU, vss

## 2023-12-21 NOTE — Patient Instructions (Signed)
 Handouts given: Polyps, Diverticulosis Resume previous diet. Continue present medications.  Await pathology results. Will follow up with pathology results, but based on today's study and current GI societal guidelines, we can likely forego repeat colonoscopy for screening purposes. Return to GI PRN.  YOU HAD AN ENDOSCOPIC PROCEDURE TODAY AT THE [facility]:   Refer to the procedure report that was given to you for any specific questions about what was found during the examination.  If the procedure report does not answer your questions, please call your gastroenterologist to clarify.  If you requested that your care partner not be given the details of your procedure findings, then the procedure report has been included in a sealed envelope for you to review at your convenience later.  YOU SHOULD EXPECT: Some feelings of bloating in the abdomen. Passage of more gas than usual.  Walking can help get rid of the air that was put into your GI tract during the procedure and reduce the bloating. If you had a lower endoscopy (such as a colonoscopy or flexible sigmoidoscopy) you may notice spotting of blood in your stool or on the toilet paper. If you underwent a bowel prep for your procedure, you may not have a normal bowel movement for a few days.  Please Note:  You might notice some irritation and congestion in your nose or some drainage.  This is from the oxygen used during your procedure.  There is no need for concern and it should clear up in a day or so.  SYMPTOMS TO REPORT IMMEDIATELY:  Following lower endoscopy (colonoscopy or flexible sigmoidoscopy):  Excessive amounts of blood in the stool  Significant tenderness or worsening of abdominal pains  Swelling of the abdomen that is new, acute  Fever of 100F or higher   For urgent or emergent issues, a gastroenterologist can be reached at any hour by calling [phone]. Do not use MyChart messaging for urgent concerns.     DIET:  We do recommend a small meal at first, but then you may proceed to your regular diet.  Drink plenty of fluids but you should avoid alcoholic beverages for 24 hours.  ACTIVITY:  You should plan to take it easy for the rest of today and you should NOT DRIVE or use heavy machinery until tomorrow (because of the sedation medicines used during the test).    FOLLOW UP: Our staff will call the number listed on your records the next business day following your procedure.  We will call around 7:15- 8:00 am to check on you and address any questions or concerns that you may have regarding the information given to you following your procedure. If we do not reach you, we will leave a message.     If any biopsies were taken you will be contacted by phone or by letter within the next 1-3 weeks.  Please call us  at [phone] if you have not heard about the biopsies in 3 weeks.    SIGNATURES/CONFIDENTIALITY: You and/or your care partner have signed paperwork which will be entered into your electronic medical record.  These signatures attest to the fact that that the information above on your After Visit Summary has been reviewed and is understood.  Full responsibility of the confidentiality of this discharge information lies with you and/or your care-partner.

## 2023-12-21 NOTE — Progress Notes (Signed)
 Pt's states no medical or surgical changes since previsit or office visit.

## 2023-12-21 NOTE — Progress Notes (Signed)
 Called to room to assist during endoscopic procedure.  Patient ID and intended procedure confirmed with present staff. Received instructions for my participation in the procedure from the performing physician.

## 2023-12-21 NOTE — Progress Notes (Signed)
 GASTROENTEROLOGY PROCEDURE H&P NOTE   Primary Care Physician: [name], MD    Reason for Procedure:  Colon polyp surveillance  Plan:    Colonoscopy  Patient is appropriate for endoscopic procedure(s) in the ambulatory ([facility]) setting.  The nature of the procedure, as well as the risks, benefits, and alternatives were carefully and thoroughly reviewed with the patient. Ample time for discussion and questions allowed. The patient understood, was satisfied, and agreed to proceed. I personally addressed all patient questions and concerns.     HPI: Stephen Roy is a 78 y.o. male who presents for colonoscopy for ongoing polyp surveillance.   Patient was most recently seen in the Gastroenterology Clinic on 12/16/2023.  No interval change in medical history since that appointment. Please refer to that note for full details regarding GI history and clinical presentation.   Colonoscopy 10/2018: - Diverticulosis in the left colon. - The examination was otherwise normal on direct and retroflexion views. - No polyps or cancers. - Repeat colonoscopy in 5 years for surveillance given 11mm SSA 3 years ago.  Past Medical History:  Diagnosis Date   Abnormal EKG    Arthritis    Bradycardia    Dizziness    Dysrhythmia    GERD (gastroesophageal reflux disease)    Hx of blood clots    post collar bone fracture, took anticoags for 41mo after   Hyperlipidemia    Hypertension    PVC's (premature ventricular contractions)     Past Surgical History:  Procedure Laterality Date   CATARACT EXTRACTION  2025   CLOSED REDUCTION HAND FRACTURE Right 1961   COLONOSCOPY  10/2015   jacobs hx polyps   FRACTURE SURGERY Left 2001   shoulder   TONSILLECTOMY  10/1949   TOTAL HIP ARTHROPLASTY Left 06/16/2023   Procedure: ARTHROPLASTY, HIP, TOTAL, ANTERIOR APPROACH;  Surgeon: Ernie Cough, MD;  Location: [facility];  Service: Orthopedics;  Laterality: Left;   TOTAL HIP ARTHROPLASTY Right 09/22/2023    Procedure: ARTHROPLASTY, HIP, TOTAL, ANTERIOR APPROACH;  Surgeon: Ernie Cough, MD;  Location: [facility];  Service: Orthopedics;  Laterality: Right;   TOTAL KNEE ARTHROPLASTY Left 06/24/2022   Procedure: TOTAL KNEE ARTHROPLASTY;  Surgeon: Ernie Cough, MD;  Location: [facility];  Service: Orthopedics;  Laterality: Left;    Prior to Admission medications   Medication Sig Start Date End Date Taking? Authorizing Provider  aspirin  EC 81 MG tablet Take 81 mg by mouth daily. Swallow whole.   Yes [provider]  atorvastatin  (LIPITOR) 10 MG tablet Take 10 mg by mouth in the morning.   Yes [provider]  flecainide  (TAMBOCOR ) 50 MG tablet Take 1 tablet (50 mg total) by mouth 2 (two) times daily. 12/15/23  Yes Lesia Ozell Barter, PA-C  irbesartan  (AVAPRO ) 300 MG tablet Take 1 tablet (300 mg total) by mouth daily. 09/16/22  Yes Camnitz, Will Gladis, MD  Multiple Vitamin (MULTIVITAMIN WITH MINERALS) TABS tablet Take 1 tablet by mouth daily. Multivitamin for Men   Yes [provider]  Omega-3 Fatty Acids (FISH OIL PO) Take 1 capsule by mouth in the morning.   Yes [provider]  sildenafil (VIAGRA) 100 MG tablet Take 100 mg by mouth daily as needed for erectile dysfunction.   Yes [provider]  amoxicillin (AMOXIL) 500 MG tablet Take 2,000 mg by mouth See admin instructions. Take 4 capsules (2000 mg) by mouth 1 hour prior to dental appointments. Patient not taking: No sig reported 09/04/23   [provider]  celecoxib (CELEBREX) 200 MG capsule Take 200 mg by mouth daily. 12/07/23   [provider]  hydrochlorothiazide  (MICROZIDE ) 12.5 MG capsule Take by mouth daily.    [provider]  oxyCODONE  (OXY IR/ROXICODONE ) 5 MG immediate release tablet Take 5 mg by mouth 2 (two) times daily as needed. for pain    [provider]    Current Outpatient Medications  Medication Sig Dispense Refill   aspirin  EC 81 MG tablet Take 81 mg  by mouth daily. Swallow whole.     atorvastatin  (LIPITOR) 10 MG tablet Take 10 mg by mouth in the morning.     flecainide  (TAMBOCOR ) 50 MG tablet Take 1 tablet (50 mg total) by mouth 2 (two) times daily. 180 tablet 3   irbesartan  (AVAPRO ) 300 MG tablet Take 1 tablet (300 mg total) by mouth daily. 90 tablet 2   Multiple Vitamin (MULTIVITAMIN WITH MINERALS) TABS tablet Take 1 tablet by mouth daily. Multivitamin for Men     Omega-3 Fatty Acids (FISH OIL PO) Take 1 capsule by mouth in the morning.     sildenafil (VIAGRA) 100 MG tablet Take 100 mg by mouth daily as needed for erectile dysfunction.     amoxicillin (AMOXIL) 500 MG tablet Take 2,000 mg by mouth See admin instructions. Take 4 capsules (2000 mg) by mouth 1 hour prior to dental appointments. (Patient not taking: No sig reported)     celecoxib (CELEBREX) 200 MG capsule Take 200 mg by mouth daily.     hydrochlorothiazide  (MICROZIDE ) 12.5 MG capsule Take by mouth daily.     oxyCODONE  (OXY IR/ROXICODONE ) 5 MG immediate release tablet Take 5 mg by mouth 2 (two) times daily as needed. for pain     Current Facility-Administered Medications  Medication Dose Route Frequency Provider Last Rate Last Admin   0.9 %  sodium chloride  infusion  500 mL Intravenous Continuous Twanda Stakes V, DO        Allergies as of 12/21/2023 - Review Complete 12/21/2023  Allergen Reaction Noted   Lisinopril Other (See Comments) 06/06/2013   Simvastatin Other (See Comments) 08/31/2017    Family History  Problem Relation Age of Onset   Heart disease Father    Colon cancer Neg Hx    Rectal cancer Neg Hx    Stomach cancer Neg Hx    Esophageal cancer Neg Hx     Social History   Socioeconomic History   Marital status: Married    Spouse name: Not on file   Number of children: Not on file   Years of education: Not on file   Highest education level: Not on file  Occupational History   Not on file  Tobacco Use   Smoking status: Never   Smokeless tobacco:  Never  Vaping Use   Vaping status: Never Used  Substance and Sexual Activity   Alcohol  use: Yes    Alcohol /week: 2.0 standard drinks of alcohol     Types: 2 Cans of beer per week    Comment: daily   Drug use: No   Sexual activity: Not on file  Other Topics Concern   Not on file  Social History Narrative   Not on file   Social Drivers of Health   Financial Resource Strain: Not on file  Food Insecurity: No Food Insecurity (09/22/2023)   Hunger Vital Sign    Worried About Running Out of Food in the Last Year: Never true    Ran Out of Food in the Last Year:  Never true  Transportation Needs: No Transportation Needs (09/22/2023)   PRAPARE - Administrator, Civil Service (Medical): No    Lack of Transportation (Non-Medical): No  Physical Activity: Not on file  Stress: Not on file  Social Connections: Socially Integrated (09/22/2023)   Social Connection and Isolation Panel    Frequency of Communication with Friends and Family: More than three times a week    Frequency of Social Gatherings with Friends and Family: More than three times a week    Attends Religious Services: 1 to 4 times per year    Active Member of [employer] or Organizations: Yes    Attends Engineer, Structural: More than 4 times per year    Marital Status: Married  Catering Manager Violence: Not At Risk (09/22/2023)   Humiliation, Afraid, Rape, and Kick questionnaire    Fear of Current or Ex-Partner: No    Emotionally Abused: No    Physically Abused: No    Sexually Abused: No    Physical Exam: Vital signs in last 24 hours: @BP  (!) 162/73   Pulse (!) 54   Temp 97.6 F (36.4 C) (Temporal)   Resp 15   Ht 6' 2 (1.88 m)   Wt 211 lb 12.8 oz (96.1 kg)   SpO2 99%   BMI 27.19 kg/m  GEN: NAD EYE: Sclerae anicteric ENT: MMM CV: Non-tachycardic Pulm: CTA b/l GI: Soft, NT/ND NEURO:  Alert & Oriented x 3   Sandor Flatter, DO [facility]   12/21/2023 11:17 AM

## 2023-12-21 NOTE — Op Note (Signed)
 [facility] Patient Name: Stephen Roy Procedure Date: 12/21/2023 11:13 AM MRN: [id number] Endoscopist: Sandor Flatter , MD, [id number] Age: 78 Referring MD:  Date of Birth: 1945/11/07 Gender: Male Account #: [id number] Procedure:                Colonoscopy Indications:              High risk colon cancer surveillance: Personal                            history of colonic polyps                           Last Colonoscopy was 10/2018 and notable for                            diverticulosis in the left colon, with                            recommendation to repeat colonoscopy in 5 years for                            surveillance given 11mm SSA on colonoscopy in 2017. Medicines:                Monitored Anesthesia Care Procedure:                Pre-Anesthesia Assessment:                           - Prior to the procedure, a History and Physical                            was performed, and patient medications and                            allergies were reviewed. The patient's tolerance of                            previous anesthesia was also reviewed. The risks                            and benefits of the procedure and the sedation                            options and risks were discussed with the patient.                            All questions were answered, and informed consent                            was obtained. Prior Anticoagulants: The patient has                            taken no anticoagulant or antiplatelet agents. ASA  Grade Assessment: II - A patient with mild systemic                            disease. After reviewing the risks and benefits,                            the patient was deemed in satisfactory condition to                            undergo the procedure.                           After obtaining informed consent, the colonoscope                            was passed under direct vision. Throughout the                             procedure, the patient's blood pressure, pulse, and                            oxygen saturations were monitored continuously. The                            Olympus Scope SN [id number] was introduced through the                            anus and advanced to the the terminal ileum. The                            colonoscopy was performed without difficulty. The                            patient tolerated the procedure well. The quality                            of the bowel preparation was good. The terminal                            ileum, ileocecal valve, appendiceal orifice, and                            rectum were photographed. Scope In: 11:24:16 AM Scope Out: 11:40:28 AM Scope Withdrawal Time: 0 hours 13 minutes 3 seconds  Total Procedure Duration: 0 hours 16 minutes 12 seconds  Findings:                 The perianal and digital rectal examinations were                            normal.                           A 3 mm polyp was found in the ascending colon. The  polyp was sessile. The polyp was removed with a                            cold snare. Resection and retrieval were complete.                            Estimated blood loss was minimal.                           Multiple medium-mouthed and small-mouthed                            diverticula were found in the sigmoid colon and                            descending colon.                           The retroflexed view of the distal rectum and anal                            verge was normal and showed no anal or rectal                            abnormalities.                           The terminal ileum appeared normal. Complications:            No immediate complications. Estimated Blood Loss:     Estimated blood loss was minimal. Impression:               - One 3 mm polyp in the ascending colon, removed                            with a cold snare. Resected and  retrieved.                           - Diverticulosis in the sigmoid colon and in the                            descending colon.                           - The distal rectum and anal verge are normal on                            retroflexion view.                           - The examined portion of the ileum was normal. Recommendation:           - Patient has a contact number available for                            emergencies. The signs and  symptoms of potential                            delayed complications were discussed with the                            patient. Return to normal activities tomorrow.                            Written discharge instructions were provided to the                            patient.                           - Resume previous diet.                           - Continue present medications.                           - Await pathology results.                           - Will follow-up pathology results, but based on                            today's study and current GI societal guidelines,                            can likley forego repeat colonoscopy for screening                            purposes.                           - Return to GI office PRN. Sandor Flatter, MD 12/21/2023 11:46:57 AM

## 2023-12-22 ENCOUNTER — Telehealth: Payer: Self-pay | Admitting: *Deleted

## 2023-12-22 NOTE — Telephone Encounter (Signed)
 Post procedure follow up call placed, no answer and left VM.

## 2023-12-24 LAB — SURGICAL PATHOLOGY

## 2023-12-30 ENCOUNTER — Ambulatory Visit: Payer: Self-pay | Admitting: Gastroenterology

## 2024-01-04 DIAGNOSIS — D1801 Hemangioma of skin and subcutaneous tissue: Secondary | ICD-10-CM | POA: Diagnosis not present

## 2024-01-04 DIAGNOSIS — L570 Actinic keratosis: Secondary | ICD-10-CM | POA: Diagnosis not present

## 2024-01-04 DIAGNOSIS — L821 Other seborrheic keratosis: Secondary | ICD-10-CM | POA: Diagnosis not present

## 2024-01-04 DIAGNOSIS — Z85828 Personal history of other malignant neoplasm of skin: Secondary | ICD-10-CM | POA: Diagnosis not present

## 2024-01-04 DIAGNOSIS — L820 Inflamed seborrheic keratosis: Secondary | ICD-10-CM | POA: Diagnosis not present

## 2024-01-04 DIAGNOSIS — D485 Neoplasm of uncertain behavior of skin: Secondary | ICD-10-CM | POA: Diagnosis not present
